# Patient Record
Sex: Female | Born: 1955 | Race: White | Hispanic: No | Smoking: Former smoker
Health system: Southern US, Community
[De-identification: ages and names within clinical notes are randomized; demographics above are authoritative.]

## PROBLEM LIST (undated history)

## (undated) DIAGNOSIS — K635 Polyp of colon: Secondary | ICD-10-CM

## (undated) DIAGNOSIS — F32A Depression, unspecified: Secondary | ICD-10-CM

## (undated) DIAGNOSIS — E039 Hypothyroidism, unspecified: Secondary | ICD-10-CM

## (undated) DIAGNOSIS — N390 Urinary tract infection, site not specified: Secondary | ICD-10-CM

## (undated) DIAGNOSIS — H269 Unspecified cataract: Secondary | ICD-10-CM

## (undated) DIAGNOSIS — E785 Hyperlipidemia, unspecified: Secondary | ICD-10-CM

## (undated) DIAGNOSIS — G4730 Sleep apnea, unspecified: Secondary | ICD-10-CM

## (undated) DIAGNOSIS — R32 Unspecified urinary incontinence: Secondary | ICD-10-CM

## (undated) DIAGNOSIS — T7840XA Allergy, unspecified, initial encounter: Secondary | ICD-10-CM

## (undated) HISTORY — DX: Polyp of colon: K63.5

## (undated) HISTORY — PX: CHOLECYSTECTOMY: SHX55

## (undated) HISTORY — PX: MOUTH SURGERY: SHX715

## (undated) HISTORY — DX: Urinary tract infection, site not specified: N39.0

## (undated) HISTORY — DX: Depression, unspecified: F32.A

## (undated) HISTORY — DX: Unspecified cataract: H26.9

## (undated) HISTORY — DX: Sleep apnea, unspecified: G47.30

## (undated) HISTORY — DX: Unspecified urinary incontinence: R32

## (undated) HISTORY — DX: Hyperlipidemia, unspecified: E78.5

## (undated) HISTORY — PX: TUBAL LIGATION: SHX77

## (undated) HISTORY — DX: Allergy, unspecified, initial encounter: T78.40XA

## (undated) HISTORY — PX: EYE SURGERY: SHX253

## (undated) HISTORY — PX: BREAST BIOPSY: SHX20

---

## 2014-09-02 ENCOUNTER — Emergency Department: Payer: Self-pay | Admitting: Emergency Medicine

## 2014-09-02 LAB — BASIC METABOLIC PANEL
Anion Gap: 14 (ref 7–16)
BUN: 21 mg/dL — ABNORMAL HIGH (ref 7–18)
CREATININE: 0.74 mg/dL (ref 0.60–1.30)
Calcium, Total: 8.9 mg/dL (ref 8.5–10.1)
Chloride: 106 mmol/L (ref 98–107)
Co2: 26 mmol/L (ref 21–32)
EGFR (African American): >60
EGFR (Non-African Amer.): >60
Glucose: 98 mg/dL (ref 65–99)
Osmolality: 294 (ref 275–301)
Potassium: 3.1 mmol/L — ABNORMAL LOW (ref 3.5–5.1)
Sodium: 146 mmol/L — ABNORMAL HIGH (ref 136–145)

## 2014-09-02 LAB — CBC WITH DIFFERENTIAL/PLATELET
BASOS ABS: 0.1 10*3/uL (ref 0.0–0.1)
Basophil %: 1 %
EOS ABS: 0.1 10*3/uL (ref 0.0–0.7)
Eosinophil %: 1.3 %
HCT: 40.4 % (ref 35.0–47.0)
HGB: 13.8 g/dL (ref 12.0–16.0)
LYMPHS ABS: 3.8 10*3/uL — AB (ref 1.0–3.6)
Lymphocyte %: 43 %
MCH: 30.1 pg (ref 26.0–34.0)
MCHC: 34.1 g/dL (ref 32.0–36.0)
MCV: 88 fL (ref 80–100)
MONO ABS: 0.8 x10 3/mm (ref 0.2–0.9)
Monocyte %: 8.5 %
NEUTROS PCT: 46.2 %
Neutrophil #: 4.1 10*3/uL (ref 1.4–6.5)
Platelet: 335 10*3/uL (ref 150–440)
RBC: 4.57 10*6/uL (ref 3.80–5.20)
RDW: 13.2 % (ref 11.5–14.5)
WBC: 8.9 10*3/uL (ref 3.6–11.0)

## 2014-09-02 LAB — TROPONIN I

## 2014-09-02 LAB — HEPATIC FUNCTION PANEL A (ARMC)
ALK PHOS: 96 U/L
AST: 25 U/L (ref 15–37)
Albumin: 4.1 g/dL (ref 3.4–5.0)
BILIRUBIN TOTAL: 0.3 mg/dL (ref 0.2–1.0)
SGPT (ALT): 29 U/L
TOTAL PROTEIN: 7.7 g/dL (ref 6.4–8.2)

## 2014-09-02 LAB — LIPASE, BLOOD: Lipase: 300 U/L (ref 73–393)

## 2014-09-24 ENCOUNTER — Ambulatory Visit: Payer: Self-pay | Admitting: Surgery

## 2015-02-23 NOTE — Op Note (Signed)
PATIENT NAME:  Carolyn Shea, Carolyn Shea MR#:  [id number] DATE OF BIRTH:  1956/09/26  DATE OF PROCEDURE:  09/24/2014   PREOPERATIVE DIAGNOSIS: Symptomatic cholelithiasis.   POSTOPERATIVE DIAGNOSIS: Symptomatic cholelithiasis.  PROCEDURE PERFORMED:  Laparoscopic cholecystectomy.   ANESTHESIA: General.   ESTIMATED BLOOD LOSS: 25 mL.   COMPLICATIONS: None.   SPECIMENS: Gallbladder.   INDICATION FOR SURGERY:  Ms. [name] is a pleasant 59 year old female who presented with recurrent right upper quadrant pain with fatty meals. She had a large gallstone on ultrasound. Her pain appeared biliary, and thus, I thought she would benefit from cholecystectomy.   DETAILS OF PROCEDURE: Informed was obtained. Ms. [name] was brought to the Operating Room suite. She was induced. Endotracheal tube was placed. General anesthesia was administered.  Her abdomen was prepped and draped in standard surgical fashion. A timeout was then performed correctly identifying the patient name, operative site and procedure to be performed. A supraumbilical incision was made and was deepened down to the fascia. The fascia was incised. The peritoneum was entered. Two stay sutures were placed through the fasciotomy. A Hassan trocar was placed in the abdomen. The abdomen was insufflated.  A 1 mm epigastric and 2 right subcostal 5 mm trocars are placed. The gallbladder was then lifted over the dome of the liver, cystic duct and cystic artery were dissected out. Critical view was obtained. Both structures were clipped and ligated. The gallbladder was then taken off the gallbladder fossa with cautery. It was then brought out through the umbilical port site. Hemostasis was obtained in the gallbladder fossa, which was mildly oozing.  When hemostasis was obtained, the abdomen had been thoroughly irrigated, abdomen was desufflated, all trocars were removed under direct visualization.  The previously placed stay sutures were tied to close the  fasciotomy in a figure of eight..  The skin was then closed with interrupted 4-0 Monocryl deep dermal sutures.  Steri-Strips, Telfa gauze and Tegaderm were used to complete the dressing. The patient was then awoken, extubated and brought to the post anesthesia care unit. There were no immediate complications. Needle, sponge, and instrument count was correct at the end of the procedure.      ____________________________ Si Raiderhristopher A. Ane Conerly, MD cal:DT D: 09/24/2014 13:19:56 ET T: 09/24/2014 14:45:29 ET JOB#: [id number]  cc: Cristal Deerhristopher A. Essance Gatti, MD, <Dictator> Jarvis NewcomerHRISTOPHER A Jandy Brackens MD ELECTRONICALLY SIGNED 10/08/2014 20:07

## 2015-02-25 LAB — SURGICAL PATHOLOGY

## 2015-12-16 DIAGNOSIS — G4700 Insomnia, unspecified: Secondary | ICD-10-CM | POA: Insufficient documentation

## 2015-12-16 DIAGNOSIS — E782 Mixed hyperlipidemia: Secondary | ICD-10-CM | POA: Insufficient documentation

## 2015-12-16 DIAGNOSIS — E039 Hypothyroidism, unspecified: Secondary | ICD-10-CM | POA: Insufficient documentation

## 2016-01-14 DIAGNOSIS — Z961 Presence of intraocular lens: Secondary | ICD-10-CM | POA: Insufficient documentation

## 2016-02-17 DIAGNOSIS — H26492 Other secondary cataract, left eye: Secondary | ICD-10-CM | POA: Insufficient documentation

## 2016-02-20 DIAGNOSIS — M792 Neuralgia and neuritis, unspecified: Secondary | ICD-10-CM | POA: Insufficient documentation

## 2016-08-08 IMAGING — US ABDOMEN ULTRASOUND LIMITED
1 series · 14 of 25 positions shown · non-contrast
Comparison: None.

CLINICAL DATA: Severe right upper quadrant pain. Nausea and
vomiting for 2 days.

EXAM:
US ABDOMEN LIMITED - RIGHT UPPER QUADRANT

[Series 1: abdomen ultrasound limited · 0.25mm/px · 14 of 41 slices shown]
[im 1/41]
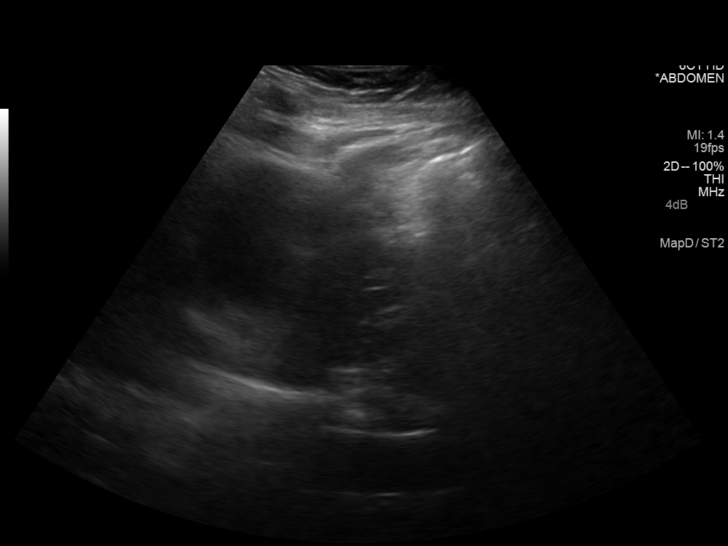
[im 4/41]
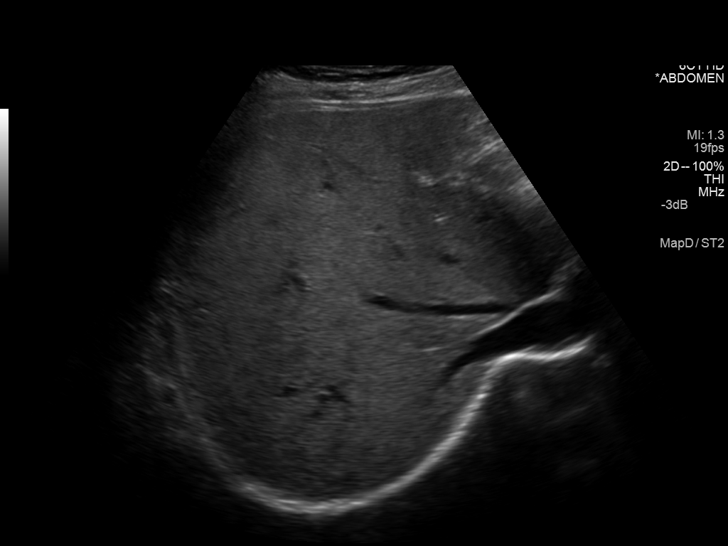
[im 7/41]
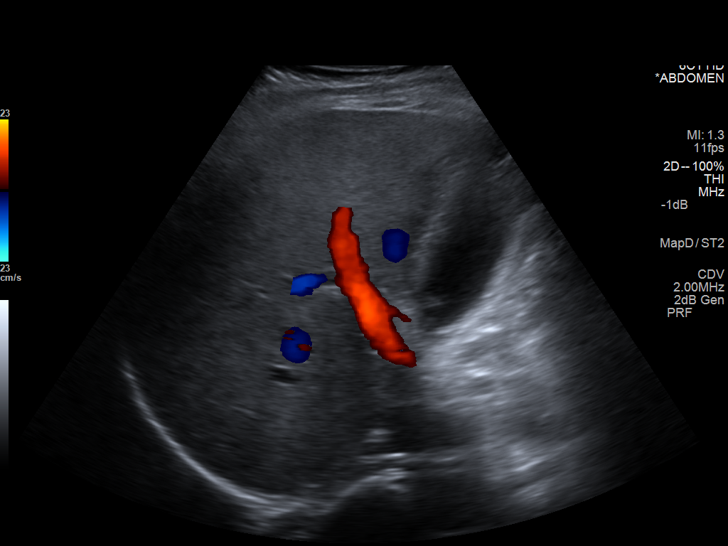
[im 11/41]
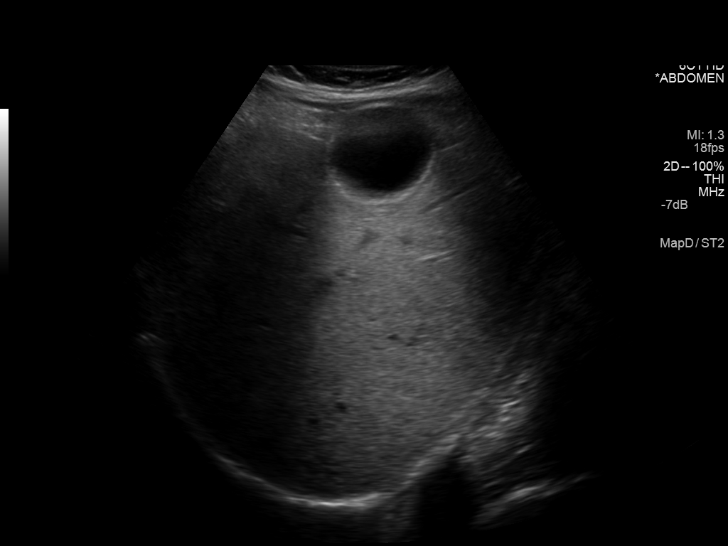
[im 14/41]
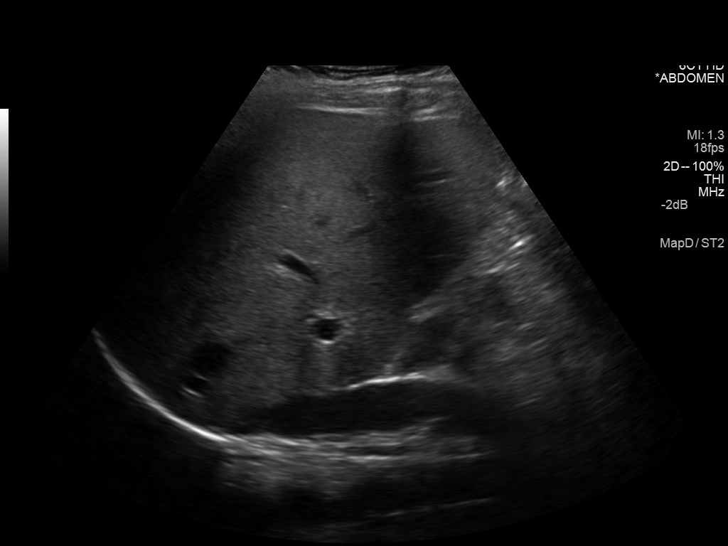
[im 16/41]
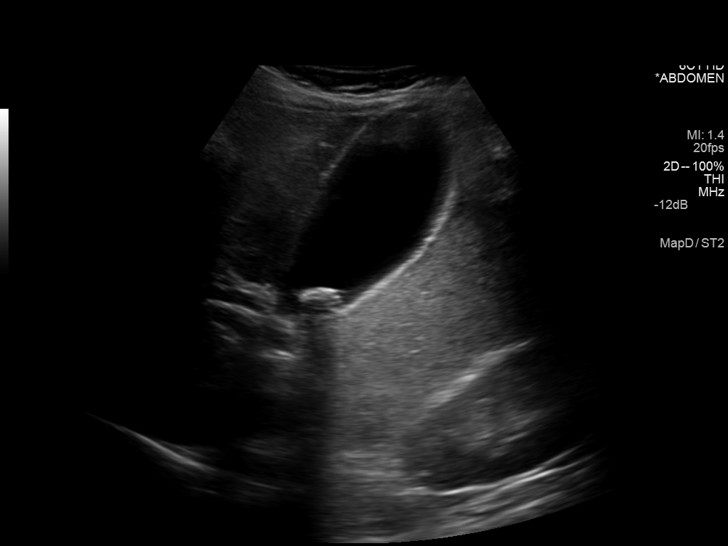
[im 19/41]
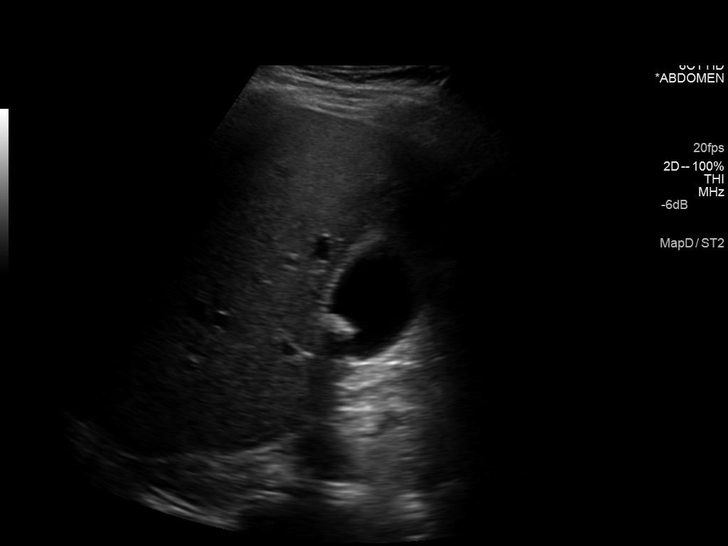
[im 22/41]
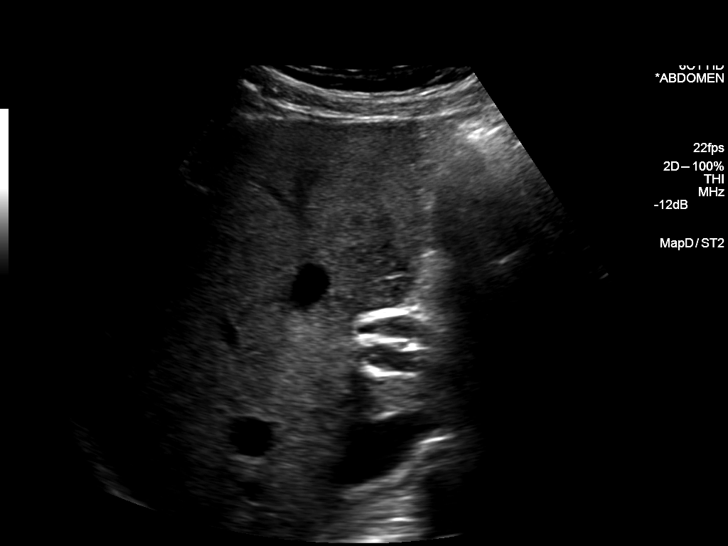
[im 26/41]
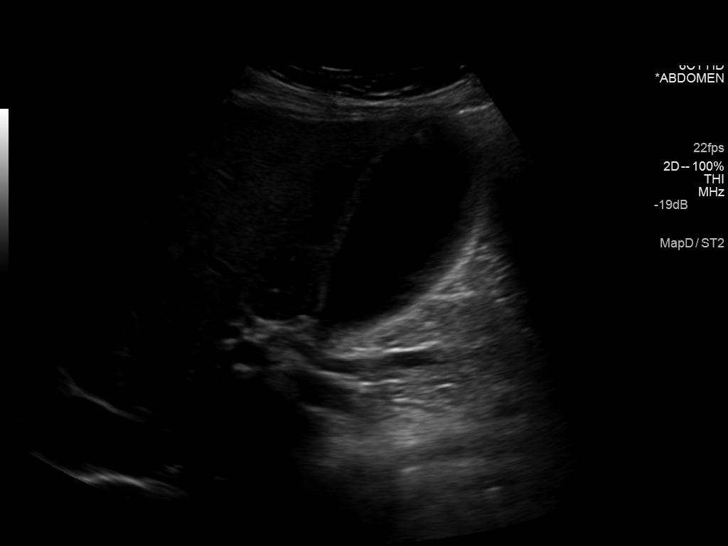
[im 27/41]
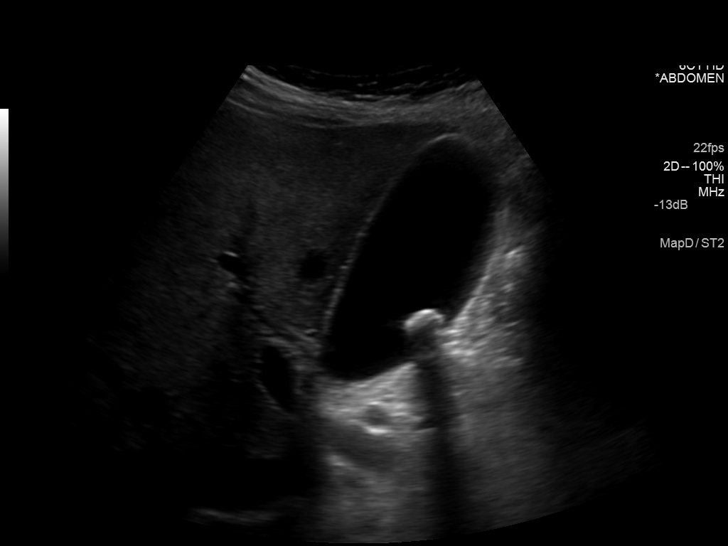
[im 31/41]
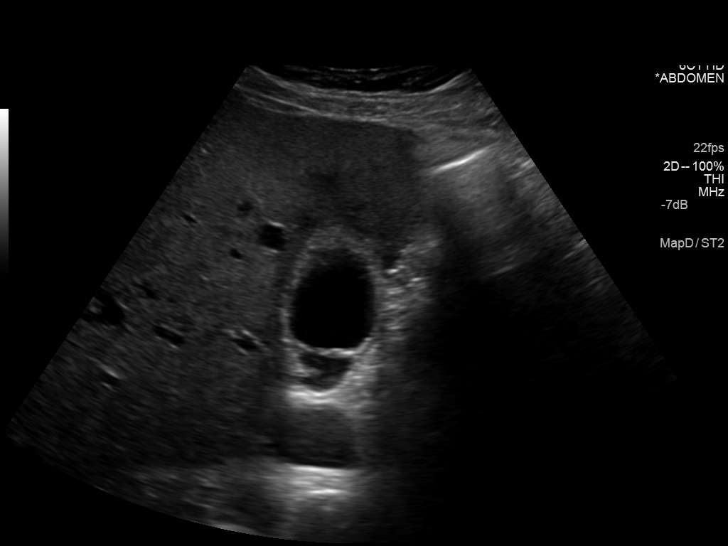
[im 34/41]
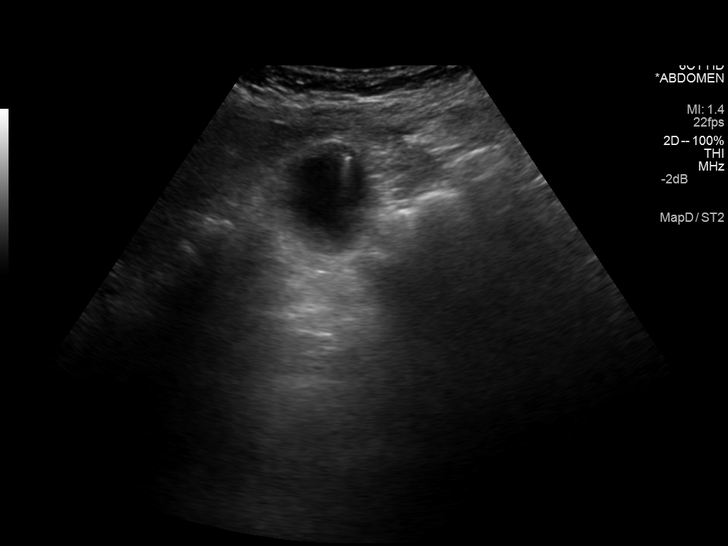
[im 37/41]
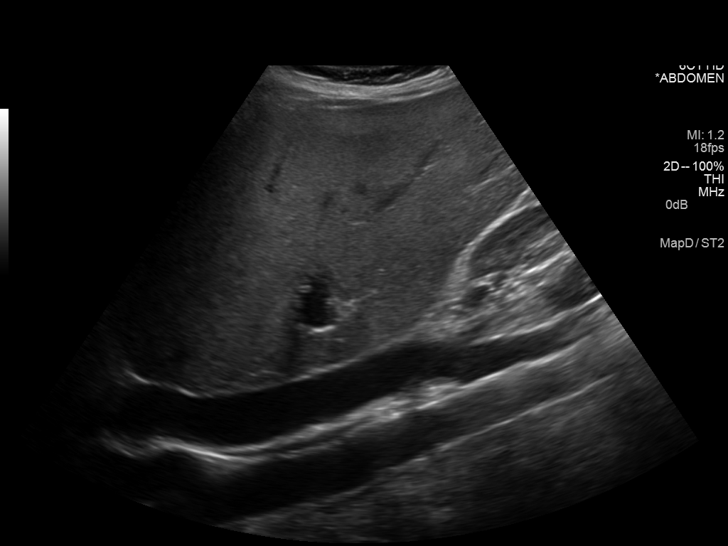
[im 41/41]
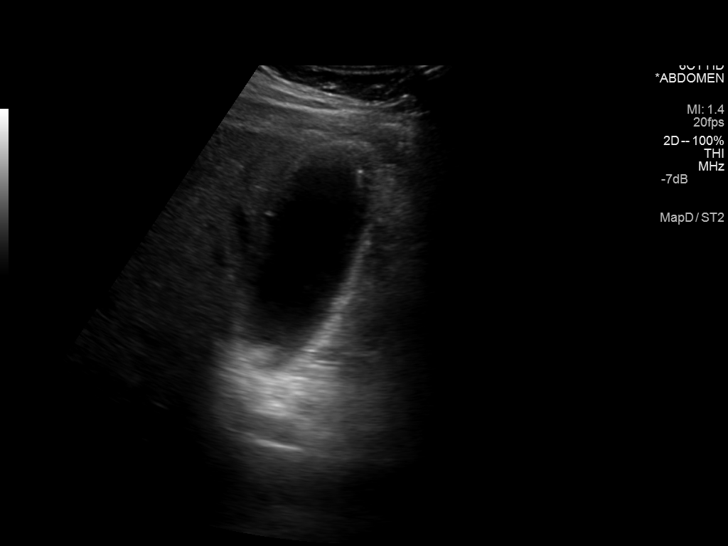

[14 of 25 positions shown; findings below may reference images not displayed]

FINDINGS: Gallbladder:

Echogenic gallstone measures up to 1.3 cm. No significant
gallbladder wall thickening. The patient does not have a sonographic
Murphy's sign. Small echogenic area with a comet tail sign at the
gallbladder fundus. This could represent a focus of adenomyomatosis.

Common bile duct:

Diameter: 0.7 cm

Liver:

No focal lesion identified. Within normal limits in parenchymal
echogenicity. Limited evaluation of the left hepatic lobe.
IMPRESSION: Cholelithiasis.  No evidence for acute cholecystitis.

Question a small focus of adenomyomatosis at the gallbladder fundus.

Common bile duct is upper limits of normal.

## 2019-02-23 DIAGNOSIS — F330 Major depressive disorder, recurrent, mild: Secondary | ICD-10-CM | POA: Insufficient documentation

## 2020-07-05 DIAGNOSIS — M8580 Other specified disorders of bone density and structure, unspecified site: Secondary | ICD-10-CM | POA: Insufficient documentation

## 2023-01-19 NOTE — Progress Notes (Unsigned)
Tomasita Morrow, NP-C Phone: [phone]  Carolyn Shea is a 67 y.o. female who presents today to establish care. She has no complaints or new concerns. She is requesting refills on her medications. She has an annual exam and labs with her last PCP in October. Note and results reviewed. Patient declines lab work today as she is not fasting and had labs in the last 6 months. She would like to return for labs prior to her annual exam in October. She is currently participating in a clinical trial with [facility] for Phentermine, she is seen by them monthly regarding this.   HYPOTHYROIDISM Disease Monitoring Weight changes: No  Skin Changes: No Palpitations: No Heat/Cold intolerance: No Medication Monitoring Compliance:  Levothyroxine 50 mcg   Last TSH 07/25/2022- 2.06  HYPERLIPIDEMIA Symptoms Chest pain on exertion:  No   Leg claudication:   No Medications: Compliance- Diet controlled Right upper quadrant pain- No  Muscle aches- No Lipid Panel 07/25/2022- LDL- 139, Total Cholesterol- 225, HDL- 71  Depression/Insomnia- Currently on Cymbalta 20 mg daily and Trazodone 100 mg at bedtime. Reports doing well on both medications. Symptoms are well managed. Denies SI/HI.   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   Resolved Ambulatory Problems    Diagnosis Date Noted   No Resolved Ambulatory Problems   Past Medical History:  Diagnosis Date   Colon polyps    Depression    Urine incontinence    UTI (urinary tract infection)     Family History  Problem Relation Age of Onset   COPD Mother    Cancer Mother    Diabetes Sister    Depression Sister    COPD Sister     Cancer Sister    Mental illness Sister    Cancer Sister    COPD Sister    Depression Sister    Diabetes Sister    Asthma Son    Heart disease Maternal Grandmother    Diabetes Maternal Grandmother    Heart disease Maternal Grandfather    COPD Maternal Grandfather    Cancer Maternal Grandfather     Social History   Socioeconomic History   Marital status: Unknown    Spouse name: Not on file   Number of children: Not on file   Years of education: Not on file   Highest education level: Not on file  Occupational History   Not on file  Tobacco Use   Smoking status: Former    Types: Cigarettes   Smokeless tobacco: Never  Substance and Sexual Activity   Alcohol use: Not on file   Drug use: Not on file   Sexual activity: Not Currently  Other Topics Concern   Not on file  Social History Narrative   Not on file   Social Determinants of Health   Financial Resource Strain: Not on file  Food Insecurity: Not on file  Transportation Needs: Not on file  Physical Activity: Not on file  Stress: Not on file  Social Connections: Not on file  Intimate Partner Violence: Not on file    ROS  General:  Negative for unexplained weight loss, fever Skin: Negative for new or changing mole, sore that won't heal HEENT:  Negative for trouble hearing, trouble seeing, ringing in ears, mouth sores, hoarseness, change in voice, dysphagia. CV:  Negative for chest pain, dyspnea, edema, palpitations Resp: Negative for cough, dyspnea, hemoptysis GI: Negative for nausea, vomiting, diarrhea, constipation, abdominal pain, melena, hematochezia. GU: Negative for dysuria, incontinence, urinary hesitance, hematuria, vaginal or penile discharge, polyuria, sexual difficulty, lumps in testicle or breasts MSK: Negative for muscle cramps or aches, joint pain or swelling Neuro: Negative for headaches, weakness, numbness, dizziness, passing out/fainting Psych: Negative for depression, anxiety, memory  problems  Objective  Physical Exam Vitals:   01/20/23 1308  BP: 122/78  Pulse: 69  Temp: 97.6 F (36.4 C)  SpO2: 99%    BP Readings from Last 3 Encounters:  01/20/23 122/78   Wt Readings from Last 3 Encounters:  01/20/23 168 lb 12.8 oz (76.6 kg)    Physical Exam Constitutional:      General: She is not in acute distress.    Appearance: Normal appearance.  HENT:     Head: Normocephalic.  Cardiovascular:     Rate and Rhythm: Normal rate and regular rhythm.     Heart sounds: Normal heart sounds.  Pulmonary:     Effort: Pulmonary effort is normal.     Breath sounds: Normal breath sounds.  Skin:    General: Skin is warm and dry.  Neurological:     General: No focal deficit present.     Mental Status: She is alert.  Psychiatric:        Mood and Affect: Mood normal.        Behavior: Behavior normal.    Assessment/Plan:   Hypothyroidism, unspecified type Assessment & Plan: Chronic. Stable on Levothyroxine 50 mcg daily. Continue. Refills sent.   Orders: -     Levothyroxine Sodium; Take 1 tablet (50 mcg total) by mouth daily before breakfast.  Dispense: 90 tablet; Refill: 3  Mixed hyperlipidemia Assessment & Plan: Chronic. Stable with diet control. Continue. Encouraged healthy diet and exercise. Will check fast lipids prior to annual exam.   Mild episode of recurrent major depressive disorder [facility]) Assessment & Plan: Chronic. Stable on Cymbalta 20 mg daily. Continue. Refills sent. Symptoms well managed at this time. Encouraged patient to contact if symptoms are changing or worsening.  Orders: -     DULoxetine HCl; Take 1 capsule (20 mg total) by mouth daily.  Dispense: 90 capsule; Refill: 3  Insomnia, unspecified type Assessment & Plan: Chronic. Stable on Trazodone 100 mg daily at bedtime. Continue. Refills sent.   Orders: -     traZODone HCl; Take 1 tablet (100 mg total) by mouth at bedtime.  Dispense: 90 tablet; Refill: 3  Screening mammogram for breast  cancer -     3D Screening Mammogram, Left and Right; Future  Screen for colon cancer -     Ambulatory referral to Gastroenterology    Return in about 7 months (around 08/09/2023) for Annual Exam. Please complete lab work 2-3 days prior.   Tomasita Morrow, NP-C [place]

## 2023-01-20 ENCOUNTER — Encounter: Payer: Self-pay | Admitting: Nurse Practitioner

## 2023-01-20 ENCOUNTER — Ambulatory Visit (INDEPENDENT_AMBULATORY_CARE_PROVIDER_SITE_OTHER): Payer: Medicare Other | Admitting: Nurse Practitioner

## 2023-01-20 VITALS — BP 122/78 | HR 69 | Temp 97.6°F | Ht 62.0 in | Wt 168.8 lb

## 2023-01-20 DIAGNOSIS — E782 Mixed hyperlipidemia: Secondary | ICD-10-CM

## 2023-01-20 DIAGNOSIS — E039 Hypothyroidism, unspecified: Secondary | ICD-10-CM

## 2023-01-20 DIAGNOSIS — F330 Major depressive disorder, recurrent, mild: Secondary | ICD-10-CM | POA: Diagnosis not present

## 2023-01-20 DIAGNOSIS — Z1211 Encounter for screening for malignant neoplasm of colon: Secondary | ICD-10-CM | POA: Diagnosis not present

## 2023-01-20 DIAGNOSIS — Z1231 Encounter for screening mammogram for malignant neoplasm of breast: Secondary | ICD-10-CM

## 2023-01-20 DIAGNOSIS — G4700 Insomnia, unspecified: Secondary | ICD-10-CM

## 2023-01-20 MED ORDER — LEVOTHYROXINE SODIUM 50 MCG PO TABS: 50.0000 ug | ORAL_TABLET | Freq: Every day | ORAL | 3 refills | Status: DC

## 2023-01-20 MED ORDER — TRAZODONE HCL 100 MG PO TABS: 100.0000 mg | ORAL_TABLET | Freq: Every day | ORAL | 3 refills | Status: DC

## 2023-01-20 MED ORDER — DULOXETINE HCL 20 MG PO CPEP: 20.0000 mg | ORAL_CAPSULE | Freq: Every day | ORAL | 3 refills | Status: DC

## 2023-01-20 NOTE — Assessment & Plan Note (Addendum)
Chronic. Stable with diet control. Continue. Encouraged healthy diet and exercise. Will check fast lipids prior to annual exam.

## 2023-01-20 NOTE — Assessment & Plan Note (Addendum)
Chronic. Stable on Cymbalta 20 mg daily. Continue. Refills sent. Symptoms well managed at this time. Encouraged patient to contact if symptoms are changing or worsening.

## 2023-01-20 NOTE — Assessment & Plan Note (Signed)
Chronic. Stable on Levothyroxine 50 mcg daily. Continue. Refills sent.

## 2023-01-20 NOTE — Patient Instructions (Signed)
YOUR MAMMOGRAM IS DUE, PLEASE CALL AND GET THIS SCHEDULED! [facility] - call [phone]    

## 2023-01-20 NOTE — Assessment & Plan Note (Addendum)
Chronic. Stable on Trazodone 100 mg daily at bedtime. Continue. Refills sent.

## 2023-01-25 ENCOUNTER — Telehealth: Payer: Self-pay

## 2023-01-25 DIAGNOSIS — Z1211 Encounter for screening for malignant neoplasm of colon: Secondary | ICD-10-CM

## 2023-01-25 NOTE — Telephone Encounter (Signed)
Gastroenterology Pre-Procedure Review  Patient has requested to call back to schedule.  She has to arrange for someone to accompany her.  Request Date: TBD Requesting Physician: Dr. Dellie Catholic  PATIENT REVIEW QUESTIONS: The patient responded to the following health history questions as indicated:    1. Are you having any GI issues? no 2. Do you have a personal history of Polyps? no 3. Do you have a family history of Colon Cancer or Polyps? no 4. Diabetes Mellitus? no 5. Joint replacements in the past 12 months?no 6. Major health problems in the past 3 months?no 7. Any artificial heart valves, MVP, or defibrillator?no    MEDICATIONS & ALLERGIES:    Patient reports the following regarding taking any anticoagulation/antiplatelet therapy:   Plavix, Coumadin, Eliquis, Xarelto, Lovenox, Pradaxa, Brilinta, or Effient? no Aspirin? no  Patient confirms/reports the following medications:  Current Outpatient Medications  Medication Sig Dispense Refill   Cholecalciferol (VITAMIN D) 50 MCG (2000 UT) CAPS Take by mouth.     cyanocobalamin (VITAMIN B12) 500 MCG tablet Take 1,000 mcg by mouth.     DULoxetine (CYMBALTA) 20 MG capsule Take 1 capsule (20 mg total) by mouth daily. 90 capsule 3   Flaxseed, Linseed, (FLAX SEED OIL) 1000 MG CAPS Take by mouth.     glucosamine-chondroitin 500-400 MG tablet Take by mouth.     levothyroxine (SYNTHROID) 50 MCG tablet Take 1 tablet (50 mcg total) by mouth daily before breakfast. 90 tablet 3   Multiple Vitamin (MULTIVITAMIN) capsule Take 1 capsule by mouth daily.     Phentermine HCl 8 MG TABS Take 1 tablet by mouth in the morning, at noon, and at bedtime.     traZODone (DESYREL) 100 MG tablet Take 1 tablet (100 mg total) by mouth at bedtime. 90 tablet 3   No current facility-administered medications for this visit.    Patient confirms/reports the following allergies:  Allergies  Allergen Reactions   Ciprofloxacin Palpitations    Patient had taken this with  Cymbalta at the time.    No orders of the defined types were placed in this encounter.   AUTHORIZATION INFORMATION Primary Insurance: 1D#: Group #:  Secondary Insurance: 1D#: Group #:  SCHEDULE INFORMATION: Date: TBD Time: Location: [place]

## 2023-02-03 ENCOUNTER — Telehealth: Payer: Self-pay

## 2023-02-03 ENCOUNTER — Other Ambulatory Visit: Payer: Self-pay

## 2023-02-03 DIAGNOSIS — Z1211 Encounter for screening for malignant neoplasm of colon: Secondary | ICD-10-CM

## 2023-02-03 MED ORDER — NA SULFATE-K SULFATE-MG SULF 17.5-3.13-1.6 GM/177ML PO SOLN: 1.0000 | Freq: Once | ORAL | 0 refills | Status: AC

## 2023-02-03 NOTE — Telephone Encounter (Signed)
Patient requesting call back to schedule colonoscopy.  ?

## 2023-02-03 NOTE — Telephone Encounter (Signed)
Gastroenterology Pre-Procedure Review  Request Date: 02/22/23 Requesting Physician: Dr. Vicente Males  PATIENT REVIEW QUESTIONS: The patient responded to the following health history questions as indicated:    Patient advised to stop Phentermine 1 day prior to colonoscopy.  1. Are you having any GI issues? no 2. Do you have a personal history of Polyps? no 3. Do you have a family history of Colon Cancer or Polyps? no 4. Diabetes Mellitus? no 5. Joint replacements in the past 12 months?no 6. Major health problems in the past 3 months?no 7. Any artificial heart valves, MVP, or defibrillator?no    MEDICATIONS & ALLERGIES:    Patient reports the following regarding taking any anticoagulation/antiplatelet therapy:   Plavix, Coumadin, Eliquis, Xarelto, Lovenox, Pradaxa, Brilinta, or Effient? no Aspirin? no  Patient confirms/reports the following medications:  Current Outpatient Medications  Medication Sig Dispense Refill   Cholecalciferol (VITAMIN D) 50 MCG (2000 UT) CAPS Take by mouth.     cyanocobalamin (VITAMIN B12) 500 MCG tablet Take 1,000 mcg by mouth.     DULoxetine (CYMBALTA) 20 MG capsule Take 1 capsule (20 mg total) by mouth daily. 90 capsule 3   Flaxseed, Linseed, (FLAX SEED OIL) 1000 MG CAPS Take by mouth.     glucosamine-chondroitin 500-400 MG tablet Take by mouth.     levothyroxine (SYNTHROID) 50 MCG tablet Take 1 tablet (50 mcg total) by mouth daily before breakfast. 90 tablet 3   Multiple Vitamin (MULTIVITAMIN) capsule Take 1 capsule by mouth daily.     Phentermine HCl 8 MG TABS Take 1 tablet by mouth in the morning, at noon, and at bedtime.     traZODone (DESYREL) 100 MG tablet Take 1 tablet (100 mg total) by mouth at bedtime. 90 tablet 3   No current facility-administered medications for this visit.    Patient confirms/reports the following allergies:  Allergies  Allergen Reactions   Ciprofloxacin Palpitations    Patient had taken this with Cymbalta at the time.    No  orders of the defined types were placed in this encounter.   AUTHORIZATION INFORMATION Primary Insurance: 1D#: Group #:  Secondary Insurance: 1D#: Group #:  SCHEDULE INFORMATION: Date:  Time: Location:

## 2023-02-03 NOTE — Addendum Note (Signed)
Addended by: Vanetta Mulders on: 02/03/2023 03:01 PM   Modules accepted: Orders

## 2023-02-04 ENCOUNTER — Ambulatory Visit
Admission: RE | Admit: 2023-02-04 | Discharge: 2023-02-04 | Disposition: A | Discharge status: Home / Self Care | Payer: Medicare Other | Source: Ambulatory Visit | Attending: Nurse Practitioner | Admitting: Nurse Practitioner

## 2023-02-04 DIAGNOSIS — Z1231 Encounter for screening mammogram for malignant neoplasm of breast: Secondary | ICD-10-CM | POA: Insufficient documentation

## 2023-02-09 ENCOUNTER — Inpatient Hospital Stay
Admission: RE | Admit: 2023-02-09 | Discharge: 2023-02-09 | Disposition: A | Discharge status: Home / Self Care | Payer: Self-pay | Source: Ambulatory Visit | Attending: Nurse Practitioner | Admitting: Nurse Practitioner

## 2023-02-09 ENCOUNTER — Other Ambulatory Visit: Payer: Self-pay | Admitting: *Deleted

## 2023-02-09 DIAGNOSIS — Z1231 Encounter for screening mammogram for malignant neoplasm of breast: Secondary | ICD-10-CM

## 2023-02-15 ENCOUNTER — Encounter: Payer: Self-pay | Admitting: Gastroenterology

## 2023-02-19 ENCOUNTER — Encounter: Payer: Self-pay | Admitting: Gastroenterology

## 2023-02-22 ENCOUNTER — Encounter
Admission: RE | Disposition: A | Discharge status: Home / Self Care | Payer: Self-pay | Source: Home / Self Care | Attending: Gastroenterology

## 2023-02-22 ENCOUNTER — Ambulatory Visit: Payer: Medicare Other | Admitting: General Practice

## 2023-02-22 ENCOUNTER — Ambulatory Visit
Admission: RE | Admit: 2023-02-22 | Discharge: 2023-02-22 | Disposition: A | Discharge status: Home / Self Care | Payer: Medicare Other | Attending: Gastroenterology | Admitting: Gastroenterology

## 2023-02-22 ENCOUNTER — Encounter: Payer: Self-pay | Admitting: Gastroenterology

## 2023-02-22 DIAGNOSIS — Z1211 Encounter for screening for malignant neoplasm of colon: Secondary | ICD-10-CM | POA: Insufficient documentation

## 2023-02-22 DIAGNOSIS — Z538 Procedure and treatment not carried out for other reasons: Secondary | ICD-10-CM | POA: Diagnosis not present

## 2023-02-22 DIAGNOSIS — Z87891 Personal history of nicotine dependence: Secondary | ICD-10-CM | POA: Diagnosis not present

## 2023-02-22 DIAGNOSIS — E039 Hypothyroidism, unspecified: Secondary | ICD-10-CM | POA: Diagnosis not present

## 2023-02-22 DIAGNOSIS — Z800 Family history of malignant neoplasm of digestive organs: Secondary | ICD-10-CM | POA: Insufficient documentation

## 2023-02-22 HISTORY — DX: Hypothyroidism, unspecified: E03.9

## 2023-02-22 HISTORY — PX: COLONOSCOPY WITH PROPOFOL: SHX5780

## 2023-02-22 SURGERY — COLONOSCOPY WITH PROPOFOL
Anesthesia: General

## 2023-02-22 MED ORDER — LIDOCAINE HCL (PF) 2 % IJ SOLN
INTRAMUSCULAR | Status: AC
  Filled 2023-02-22: qty 5

## 2023-02-22 MED ORDER — PROPOFOL 10 MG/ML IV BOLUS
INTRAVENOUS | Status: DC | PRN
  Administered 2023-02-22: 80 mg via INTRAVENOUS

## 2023-02-22 MED ORDER — SODIUM CHLORIDE 0.9 % IV SOLN: INTRAVENOUS | Status: DC

## 2023-02-22 MED ORDER — PROPOFOL 1000 MG/100ML IV EMUL
INTRAVENOUS | Status: AC
  Filled 2023-02-22: qty 100

## 2023-02-22 MED ORDER — LIDOCAINE HCL (CARDIAC) PF 100 MG/5ML IV SOSY
PREFILLED_SYRINGE | INTRAVENOUS | Status: DC | PRN
  Administered 2023-02-22: 40 mg via INTRAVENOUS

## 2023-02-22 MED ORDER — PROPOFOL 500 MG/50ML IV EMUL
INTRAVENOUS | Status: DC | PRN
  Administered 2023-02-22: 150 ug/kg/min via INTRAVENOUS

## 2023-02-22 NOTE — Transfer of Care (Signed)
Immediate Anesthesia Transfer of Care Note  Patient: Carolyn Shea  Procedure(s) Performed: Procedure(s): COLONOSCOPY WITH PROPOFOL (N/A)  Patient Location: PACU and Endoscopy Unit  Anesthesia Type:General  Level of Consciousness: sedated  Airway & Oxygen Therapy: Patient Spontanous Breathing and Patient connected to nasal cannula oxygen  Post-op Assessment: Report given to RN and Post -op Vital signs reviewed and stable  Post vital signs: Reviewed and stable  Last Vitals:  Vitals:   02/22/23 0834 02/22/23 0915  BP: 130/71 (!) 100/52  Pulse: 60 (!) 59  Resp: 17 14  Temp: (!) 35.7 C (!) 35.6 C  SpO2: 99% 100%    Complications: No apparent anesthesia complications

## 2023-02-22 NOTE — Op Note (Signed)
[facility] Patient Name: Carolyn Shea Procedure Date: 02/22/2023 8:50 AM MRN: [id number] Account #: [id number] Date of Birth: [birth year] Admit Type: Outpatient Age: 67 Room: [facility] ENDO ROOM 1 Gender: Female Note Status: Finalized Instrument Name: Prentice Docker [id number] Procedure:             Colonoscopy Indications:           Screening in patient at increased risk: Family history                         of 1st-degree relative with colorectal cancer Providers:             Wyline Mood MD, MD Referring MD:          No Local Md, MD (Referring MD) Medicines:             Monitored Anesthesia Care Complications:         No immediate complications. Procedure:             Pre-Anesthesia Assessment:                        - Prior to the procedure, a History and Physical was                         performed, and patient medications, allergies and                         sensitivities were reviewed. The patient's tolerance                         of previous anesthesia was reviewed.                        - The risks and benefits of the procedure and the                         sedation options and risks were discussed with the                         patient. All questions were answered and informed                         consent was obtained.                        - ASA Grade Assessment: II - A patient with mild                         systemic disease.                        After obtaining informed consent, the colonoscope was                         passed under direct vision. Throughout the procedure,                         the patient's blood pressure, pulse, and oxygen  saturations were monitored continuously. The                         Colonoscope was introduced through the anus with the                         intention of advancing to the cecum. The scope was                         advanced to the transverse colon  before the procedure                         was aborted. Medications were given. The colonoscopy                         was performed with ease. The patient tolerated the                         procedure well. The quality of the bowel preparation                         was inadequate. Findings:      The perianal and digital rectal examinations were normal.      Copious quantities of semi-liquid stool was found in the sigmoid colon,       in the descending colon and in the transverse colon, interfering with       visualization. Impression:            - Preparation of the colon was inadequate.                        - Stool in the sigmoid colon, in the descending colon                         and in the transverse colon.                        - No specimens collected. Recommendation:        - Discharge patient to home (with escort).                        - Resume previous diet.                        - Continue present medications.                        - Repeat colonoscopy in 2 weeks because the bowel                         preparation was suboptimal. Procedure Code(s):     --- Professional ---                        [phone], 53, Colonoscopy, flexible; diagnostic,                         including collection of specimen(s) by brushing or  washing, when performed (separate procedure) Diagnosis Code(s):     --- Professional ---                        Z80.0, Family history of malignant neoplasm of                         digestive organs CPT copyright 2022 American Medical Association. All rights reserved. The codes documented in this report are preliminary and upon coder review may  be revised to meet current compliance requirements. Wyline Mood, MD Wyline Mood MD, MD 02/22/2023 9:11:45 AM This report has been signed electronically. Number of Addenda: 0 Note Initiated On: 02/22/2023 8:50 AM Total Procedure Duration: 0 hours 6 minutes 26 seconds  Estimated  Blood Loss:  Estimated blood loss: none.      [facility]

## 2023-02-22 NOTE — H&P (Signed)
Wyline Mood, MD [address], [address], [place], [place], [address] [address], [address], [place], [place], [address] Phone: [phone]  Fax: [phone]  Primary Care Physician:  Bethanie Dicker, NP   Pre-Procedure History & Physical: HPI:  Carolyn Shea is a 67 y.o. female is here for an colonoscopy.   Past Medical History:  Diagnosis Date   Colon polyps    Depression    Hypothyroidism    Urine incontinence    UTI (urinary tract infection)     Past Surgical History:  Procedure Laterality Date   BREAST BIOPSY Left    CHOLECYSTECTOMY     MOUTH SURGERY      Prior to Admission medications   Medication Sig Start Date End Date Taking? Authorizing Provider  levothyroxine (SYNTHROID) 50 MCG tablet Take 1 tablet (50 mcg total) by mouth daily before breakfast. 01/20/23  Yes Bethanie Dicker, NP  Phentermine HCl 8 MG TABS Take 1 tablet by mouth in the morning, at noon, and at bedtime.   Yes [provider]  traZODone (DESYREL) 100 MG tablet Take 1 tablet (100 mg total) by mouth at bedtime. 01/20/23  Yes Bethanie Dicker, NP  Cholecalciferol (VITAMIN D) 50 MCG (2000 UT) CAPS Take by mouth.    [provider]  cyanocobalamin (VITAMIN B12) 500 MCG tablet Take 1,000 mcg by mouth. 11/29/15   [provider]  DULoxetine (CYMBALTA) 20 MG capsule Take 1 capsule (20 mg total) by mouth daily. 01/20/23   Bethanie Dicker, NP  Flaxseed, Linseed, (FLAX SEED OIL) 1000 MG CAPS Take by mouth. 02/20/16   [provider]  glucosamine-chondroitin 500-400 MG tablet Take by mouth. 11/29/15   [provider]  Multiple Vitamin (MULTIVITAMIN) capsule Take 1 capsule by mouth daily.    [provider]    Allergies as of 02/03/2023 - Review Complete 02/03/2023  Allergen Reaction Noted   Ciprofloxacin Palpitations 02/02/2015    Family History  Problem Relation Age of Onset   COPD Mother    Cancer Mother    Diabetes Sister    Depression Sister    COPD  Sister    Cancer Sister    Mental illness Sister    Cancer Sister    COPD Sister    Depression Sister    Diabetes Sister    Asthma Son    Heart disease Maternal Grandmother    Diabetes Maternal Grandmother    Heart disease Maternal Grandfather    COPD Maternal Grandfather    Cancer Maternal Grandfather     Social History   Socioeconomic History   Marital status: Unknown    Spouse name: Not on file   Number of children: Not on file   Years of education: Not on file   Highest education level: Not on file  Occupational History   Not on file  Tobacco Use   Smoking status: Former    Types: Cigarettes   Smokeless tobacco: Never  Vaping Use   Vaping Use: Never used  Substance and Sexual Activity   Alcohol use: Not Currently   Drug use: Never   Sexual activity: Not Currently  Other Topics Concern   Not on file  Social History Narrative   Not on file   Social Determinants of Health   Financial Resource Strain: Not on file  Food Insecurity: Not on file  Transportation Needs: Not on file  Physical Activity: Not on file  Stress: Not on file  Social Connections: Not on file  Intimate Partner Violence: Not on file    Review of Systems: See HPI, otherwise negative ROS  Physical Exam: BP 130/71   Pulse 60   Temp (!) 96.2 F (35.7 C) (Temporal)   Resp 17   Wt 72.5 kg   SpO2 99%   BMI 29.23 kg/m  General:   Alert,  pleasant and cooperative in NAD Head:  Normocephalic and atraumatic. Neck:  Supple; no masses or thyromegaly. Lungs:  Clear throughout to auscultation, normal respiratory effort.    Heart:  +S1, +S2, Regular rate and rhythm, No edema. Abdomen:  Soft, nontender and nondistended. Normal bowel sounds, without guarding, and without rebound.   Neurologic:  Alert and  oriented x4;  grossly normal neurologically.  Impression/Plan: Carolyn Shea is here for an colonoscopy to be performed for family history of colon cancer.  Risks, benefits,  limitations, and alternatives regarding  colonoscopy have been reviewed with the patient.  Questions have been answered.  All parties agreeable.   Wyline Mood, MD  02/22/2023, 8:51 AM

## 2023-02-22 NOTE — Anesthesia Preprocedure Evaluation (Signed)
Anesthesia Evaluation  Patient identified by MRN, date of birth, ID band Patient awake    Reviewed: Allergy & Precautions, NPO status , Patient's Chart, lab work & pertinent test results  Airway Mallampati: III  TM Distance: >3 FB Neck ROM: full    Dental  (+) Chipped, Dental Advidsory Given   Pulmonary neg COPD, former smoker   Pulmonary exam normal        Cardiovascular (-) angina (-) Past MI and (-) CABG negative cardio ROS Normal cardiovascular exam     Neuro/Psych  PSYCHIATRIC DISORDERS      negative neurological ROS     GI/Hepatic negative GI ROS, Neg liver ROS,,,  Endo/Other  Hypothyroidism    Renal/GU negative Renal ROS  negative genitourinary   Musculoskeletal   Abdominal   Peds  Hematology negative hematology ROS (+)   Anesthesia Other Findings Past Medical History: No date: Colon polyps No date: Depression No date: Hypothyroidism No date: Urine incontinence No date: UTI (urinary tract infection)  Past Surgical History: No date: BREAST BIOPSY; Left No date: CHOLECYSTECTOMY No date: MOUTH SURGERY     Reproductive/Obstetrics negative OB ROS                             Anesthesia Physical Anesthesia Plan  ASA: 2  Anesthesia Plan: General   Post-op Pain Management: Minimal or no pain anticipated   Induction: Intravenous  PONV Risk Score and Plan: 3 and Propofol infusion, TIVA and Ondansetron  Airway Management Planned: Nasal Cannula  Additional Equipment: None  Intra-op Plan:   Post-operative Plan:   Informed Consent: I have reviewed the patients History and Physical, chart, labs and discussed the procedure including the risks, benefits and alternatives for the proposed anesthesia with the patient or authorized representative who has indicated his/her understanding and acceptance.     Dental advisory given  Plan Discussed with: CRNA and  Surgeon  Anesthesia Plan Comments: (Discussed risks of anesthesia with patient, including possibility of difficulty with spontaneous ventilation under anesthesia necessitating airway intervention, PONV, and rare risks such as cardiac or respiratory or neurological events, and allergic reactions. Discussed the role of CRNA in patient's perioperative care. Patient understands.)       Anesthesia Quick Evaluation

## 2023-02-22 NOTE — Anesthesia Postprocedure Evaluation (Signed)
Anesthesia Post Note  Patient: KALIOPE QUINONEZ  Procedure(s) Performed: COLONOSCOPY WITH PROPOFOL  Patient location during evaluation: Endoscopy Anesthesia Type: General Level of consciousness: awake and alert Pain management: pain level controlled Vital Signs Assessment: post-procedure vital signs reviewed and stable Respiratory status: spontaneous breathing, nonlabored ventilation, respiratory function stable and patient connected to nasal cannula oxygen Cardiovascular status: blood pressure returned to baseline and stable Postop Assessment: no apparent nausea or vomiting Anesthetic complications: no  No notable events documented.   Last Vitals:  Vitals:   02/22/23 0925 02/22/23 0935  BP: 110/77 122/66  Pulse: 63 (!) 54  Resp: 15 15  Temp:    SpO2: 97% 99%    Last Pain:  Vitals:   02/22/23 0935  TempSrc:   PainSc: 0-No pain                 Stephanie Coup

## 2023-02-24 ENCOUNTER — Telehealth: Payer: Self-pay

## 2023-02-24 ENCOUNTER — Encounter: Payer: Self-pay | Admitting: Gastroenterology

## 2023-02-24 DIAGNOSIS — Z1211 Encounter for screening for malignant neoplasm of colon: Secondary | ICD-10-CM

## 2023-02-24 NOTE — Telephone Encounter (Signed)
Pt had a colonoscopy done on Monday and was not cleaned out. Dr. Tobi Bastos recommended her to repeat in 2 weeks. Please call pt to reschedule. This was originally a screening. Pt available anytime of the day, but prefers an afternoon call.

## 2023-03-01 ENCOUNTER — Other Ambulatory Visit: Payer: Self-pay

## 2023-03-01 DIAGNOSIS — Z1211 Encounter for screening for malignant neoplasm of colon: Secondary | ICD-10-CM

## 2023-03-01 MED ORDER — GOLYTELY 236 G PO SOLR: 4000.0000 mL | Freq: Two times a day (BID) | ORAL | 0 refills | Status: AC

## 2023-03-01 NOTE — Addendum Note (Signed)
Addended by: Avie Arenas on: 03/01/2023 09:09 AM   Modules accepted: Orders

## 2023-03-01 NOTE — Telephone Encounter (Signed)
Repeat colonoscopy has been rescheduled with Dr. Tobi Bastos on 04/05/23.  2 day prep using Golytely instructions explained as follows:  04/02/23- until Noon on 04/03/23 Low Fiber Diet 04/03/23 at Noon start Clear liquid diet 06/01//24 at 5pm Mix 1 jug of Golytely prep with clear liquid.  Drink 8 oz every 15-30 mins until half has been completed. Stop Penentermine on 04/04/23 04/04/23 continue Clear Liquid Diet.  At 5pm resume drinking the remainder of Golytely prep drinking 8 oz every 15-30 mins until entire contents have been completed. 04/05/23 5 hours before procedure Mix 2nd Golytely prep with clear liquid.  Drink 8 oz every 15-30 mins until half has been completed.   Do Not Eat or Drink Anything 4 hours prior to colonoscopy.

## 2023-04-02 ENCOUNTER — Encounter: Payer: Self-pay | Admitting: Gastroenterology

## 2023-04-05 ENCOUNTER — Encounter
Admission: RE | Disposition: A | Discharge status: Home / Self Care | Payer: Self-pay | Source: Home / Self Care | Attending: Gastroenterology

## 2023-04-05 ENCOUNTER — Ambulatory Visit: Payer: Medicare Other | Admitting: Anesthesiology

## 2023-04-05 ENCOUNTER — Ambulatory Visit
Admission: RE | Admit: 2023-04-05 | Discharge: 2023-04-05 | Disposition: A | Discharge status: Home / Self Care | Payer: Medicare Other | Attending: Gastroenterology | Admitting: Gastroenterology

## 2023-04-05 DIAGNOSIS — Z800 Family history of malignant neoplasm of digestive organs: Secondary | ICD-10-CM

## 2023-04-05 DIAGNOSIS — Z1211 Encounter for screening for malignant neoplasm of colon: Secondary | ICD-10-CM | POA: Insufficient documentation

## 2023-04-05 DIAGNOSIS — F32A Depression, unspecified: Secondary | ICD-10-CM | POA: Insufficient documentation

## 2023-04-05 DIAGNOSIS — Z87891 Personal history of nicotine dependence: Secondary | ICD-10-CM | POA: Diagnosis not present

## 2023-04-05 DIAGNOSIS — E039 Hypothyroidism, unspecified: Secondary | ICD-10-CM | POA: Insufficient documentation

## 2023-04-05 HISTORY — PX: COLONOSCOPY WITH PROPOFOL: SHX5780

## 2023-04-05 SURGERY — COLONOSCOPY WITH PROPOFOL
Anesthesia: General

## 2023-04-05 MED ORDER — PROPOFOL 500 MG/50ML IV EMUL
INTRAVENOUS | Status: DC | PRN
  Administered 2023-04-05: 120 ug/kg/min via INTRAVENOUS

## 2023-04-05 MED ORDER — SIMETHICONE 40 MG/0.6ML PO SUSP
ORAL | Status: DC | PRN
  Administered 2023-04-05: 120 mL

## 2023-04-05 MED ORDER — LIDOCAINE HCL (CARDIAC) PF 100 MG/5ML IV SOSY
PREFILLED_SYRINGE | INTRAVENOUS | Status: DC | PRN
  Administered 2023-04-05: 50 mg via INTRAVENOUS

## 2023-04-05 MED ORDER — PROPOFOL 10 MG/ML IV BOLUS
INTRAVENOUS | Status: DC | PRN
  Administered 2023-04-05: 100 mg via INTRAVENOUS

## 2023-04-05 MED ORDER — SODIUM CHLORIDE 0.9 % IV SOLN: INTRAVENOUS | Status: DC

## 2023-04-05 NOTE — Anesthesia Preprocedure Evaluation (Addendum)
Anesthesia Evaluation  Patient identified by MRN, date of birth, ID band Patient awake    Reviewed: Allergy & Precautions, NPO status , Patient's Chart, lab work & pertinent test results  Airway Mallampati: III  TM Distance: >3 FB Neck ROM: full    Dental  (+) Dental Advidsory Given, Partial Lower   Pulmonary neg COPD, former smoker   Pulmonary exam normal        Cardiovascular (-) angina (-) Past MI and (-) CABG negative cardio ROS Normal cardiovascular exam     Neuro/Psych  PSYCHIATRIC DISORDERS  Depression    negative neurological ROS     GI/Hepatic negative GI ROS, Neg liver ROS,,,  Endo/Other  Hypothyroidism    Renal/GU negative Renal ROS  negative genitourinary   Musculoskeletal   Abdominal   Peds  Hematology negative hematology ROS (+)   Anesthesia Other Findings Past Medical History: No date: Colon polyps No date: Depression No date: Hypothyroidism No date: Urine incontinence No date: UTI (urinary tract infection)  Past Surgical History: No date: BREAST BIOPSY; Left No date: CHOLECYSTECTOMY No date: MOUTH SURGERY     Reproductive/Obstetrics negative OB ROS                             Anesthesia Physical Anesthesia Plan  ASA: 2  Anesthesia Plan: General   Post-op Pain Management: Minimal or no pain anticipated   Induction: Intravenous  PONV Risk Score and Plan: 3 and Propofol infusion, TIVA and Ondansetron  Airway Management Planned: Nasal Cannula  Additional Equipment: None  Intra-op Plan:   Post-operative Plan:   Informed Consent: I have reviewed the patients History and Physical, chart, labs and discussed the procedure including the risks, benefits and alternatives for the proposed anesthesia with the patient or authorized representative who has indicated his/her understanding and acceptance.     Dental advisory given  Plan Discussed with: CRNA and  Surgeon  Anesthesia Plan Comments:        Anesthesia Quick Evaluation

## 2023-04-05 NOTE — Anesthesia Postprocedure Evaluation (Signed)
Anesthesia Post Note  Patient: Carolyn Shea  Procedure(s) Performed: COLONOSCOPY WITH PROPOFOL  Patient location during evaluation: Endoscopy Anesthesia Type: General Level of consciousness: awake and alert Pain management: pain level controlled Vital Signs Assessment: post-procedure vital signs reviewed and stable Respiratory status: spontaneous breathing, nonlabored ventilation and respiratory function stable Cardiovascular status: blood pressure returned to baseline and stable Postop Assessment: no apparent nausea or vomiting Anesthetic complications: no   No notable events documented.   Last Vitals:  Vitals:   04/05/23 1010 04/05/23 1020  BP: 127/74 133/74  Pulse: 60 (!) 57  Resp: 14 13  Temp:    SpO2: 98% 100%    Last Pain:  Vitals:   04/05/23 1020  TempSrc:   PainSc: 0-No pain                 Foye Deer

## 2023-04-05 NOTE — Op Note (Signed)
[facility] Patient Name: Carolyn Shea Procedure Date: 04/05/2023 9:19 AM MRN: [id number] Account #: [id number] Date of Birth: 09/10/1956 Admit Type: Outpatient Age: 67 Room: [facility] ENDO ROOM 1 Gender: Female Note Status: Finalized Instrument Name: Prentice Docker [id number] Procedure:             Colonoscopy Indications:           Screening in patient at increased risk: Family history                         of 1st-degree relative with colorectal cancer Providers:             Wyline Mood MD, MD Referring MD:          Bethanie Dicker Medicines:             Monitored Anesthesia Care Complications:         No immediate complications. Procedure:             Pre-Anesthesia Assessment:                        - Prior to the procedure, a History and Physical was                         performed, and patient medications, allergies and                         sensitivities were reviewed. The patient's tolerance                         of previous anesthesia was reviewed.                        - The risks and benefits of the procedure and the                         sedation options and risks were discussed with the                         patient. All questions were answered and informed                         consent was obtained.                        - The risks and benefits of the procedure and the                         sedation options and risks were discussed with the                         patient. All questions were answered and informed                         consent was obtained.                        After obtaining informed consent, the colonoscope was  passed under direct vision. Throughout the procedure,                         the patient's blood pressure, pulse, and oxygen                         saturations were monitored continuously. The                         Colonoscope was introduced through the anus and                          advanced to the the cecum, identified by the                         appendiceal orifice. The colonoscopy was performed                         with ease. The patient tolerated the procedure well.                         The quality of the bowel preparation was good. The                         ileocecal valve, appendiceal orifice, and rectum were                         photographed. Findings:      The perianal and digital rectal examinations were normal.      The entire examined colon appeared normal on direct and retroflexion       views. Impression:            - The entire examined colon is normal on direct and                         retroflexion views.                        - No specimens collected. Recommendation:        - Discharge patient to home.                        - Resume previous diet.                        - Continue present medications.                        - Repeat colonoscopy in 5 years for screening purposes. Procedure Code(s):     --- Professional ---                        [phone], Colonoscopy, flexible; diagnostic, including                         collection of specimen(s) by brushing or washing, when                         performed (separate procedure) Diagnosis Code(s):     --- Professional ---  Z80.0, Family history of malignant neoplasm of                         digestive organs CPT copyright 2022 American Medical Association. All rights reserved. The codes documented in this report are preliminary and upon coder review may  be revised to meet current compliance requirements. Wyline Mood, MD Wyline Mood MD, MD 04/05/2023 10:01:20 AM This report has been signed electronically. Number of Addenda: 0 Note Initiated On: 04/05/2023 9:19 AM Scope Withdrawal Time: 0 hours 11 minutes 52 seconds  Total Procedure Duration: 0 hours 18 minutes 57 seconds  Estimated Blood Loss:  Estimated blood loss: none.      [facility]

## 2023-04-05 NOTE — Transfer of Care (Signed)
Immediate Anesthesia Transfer of Care Note  Patient: Carolyn Shea  Procedure(s) Performed: COLONOSCOPY WITH PROPOFOL  Patient Location: PACU and Endoscopy Unit  Anesthesia Type:General  Level of Consciousness: drowsy and patient cooperative  Airway & Oxygen Therapy: Patient Spontanous Breathing  Post-op Assessment: Report given to RN and Post -op Vital signs reviewed and stable  Post vital signs: Reviewed and stable  Last Vitals:  Vitals Value Taken Time  BP 102/69 04/05/23 1000  Temp    Pulse 65 04/05/23 1000  Resp 17 04/05/23 1000  SpO2 100 % 04/05/23 1000    Last Pain:  Vitals:   04/05/23 1000  TempSrc:   PainSc: Asleep         Complications: No notable events documented.

## 2023-04-05 NOTE — H&P (Signed)
Wyline Mood, MD [address], [address], [place], [place], [address] [address], [place], [place], [address] Phone: [phone]  Fax: [phone]  Primary Care Physician:  Bethanie Dicker, NP   Pre-Procedure History & Physical: HPI:  Carolyn Shea is a 67 y.o. female is here for an colonoscopy.   Past Medical History:  Diagnosis Date   Colon polyps    Depression    Hypothyroidism    Urine incontinence    UTI (urinary tract infection)     Past Surgical History:  Procedure Laterality Date   BREAST BIOPSY Left    CHOLECYSTECTOMY     COLONOSCOPY WITH PROPOFOL N/A 02/22/2023   Procedure: COLONOSCOPY WITH PROPOFOL;  Surgeon: Wyline Mood, MD;  Location: [facility] ENDOSCOPY;  Service: Gastroenterology;  Laterality: N/A;   MOUTH SURGERY      Prior to Admission medications   Medication Sig Start Date End Date Taking? Authorizing Provider  Cholecalciferol (VITAMIN D) 50 MCG (2000 UT) CAPS Take by mouth.    [provider]  cyanocobalamin (VITAMIN B12) 500 MCG tablet Take 1,000 mcg by mouth. 11/29/15   [provider]  DULoxetine (CYMBALTA) 20 MG capsule Take 1 capsule (20 mg total) by mouth daily. 01/20/23   Bethanie Dicker, NP  Flaxseed, Linseed, (FLAX SEED OIL) 1000 MG CAPS Take by mouth. 02/20/16   [provider]  glucosamine-chondroitin 500-400 MG tablet Take by mouth. 11/29/15   [provider]  levothyroxine (SYNTHROID) 50 MCG tablet Take 1 tablet (50 mcg total) by mouth daily before breakfast. 01/20/23   Bethanie Dicker, NP  Multiple Vitamin (MULTIVITAMIN) capsule Take 1 capsule by mouth daily.    [provider]  Phentermine HCl 8 MG TABS Take 1 tablet by mouth in the morning, at noon, and at bedtime.    [provider]  traZODone (DESYREL) 100 MG tablet Take 1 tablet (100 mg total) by mouth at bedtime. 01/20/23   Bethanie Dicker, NP    Allergies as of 03/01/2023 - Review Complete 02/22/2023  Allergen Reaction Noted    Ciprofloxacin Palpitations 02/02/2015    Family History  Problem Relation Age of Onset   COPD Mother    Cancer Mother    Diabetes Sister    Depression Sister    COPD Sister    Cancer Sister    Mental illness Sister    Cancer Sister    COPD Sister    Depression Sister    Diabetes Sister    Asthma Son    Heart disease Maternal Grandmother    Diabetes Maternal Grandmother    Heart disease Maternal Grandfather    COPD Maternal Grandfather    Cancer Maternal Grandfather     Social History   Socioeconomic History   Marital status: Unknown    Spouse name: Not on file   Number of children: Not on file   Years of education: Not on file   Highest education level: Not on file  Occupational History   Not on file  Tobacco Use   Smoking status: Former    Types: Cigarettes   Smokeless tobacco: Never  Vaping Use   Vaping Use: Never used  Substance and Sexual Activity   Alcohol use: Not Currently   Drug use: Never   Sexual activity: Not Currently  Other Topics Concern   Not on file  Social History Narrative   Not on file   Social Determinants of Health   Financial Resource Strain: Not on file  Food Insecurity: Not on file  Transportation Needs: Not on file  Physical Activity: Not on file  Stress: Not on file  Social Connections: Not on file  Intimate Partner Violence: Not on file    Review of Systems: See HPI, otherwise negative ROS  Physical Exam: There were no vitals taken for this visit. General:   Alert,  pleasant and cooperative in NAD Head:  Normocephalic and atraumatic. Neck:  Supple; no masses or thyromegaly. Lungs:  Clear throughout to auscultation, normal respiratory effort.    Heart:  +S1, +S2, Regular rate and rhythm, No edema. Abdomen:  Soft, nontender and nondistended. Normal bowel sounds, without guarding, and without rebound.   Neurologic:  Alert and  oriented x4;  grossly normal neurologically.  Impression/Plan: Carolyn Shea is here for  an colonoscopy to be performed for family history of colon cancer Risks, benefits, limitations, and alternatives regarding  colonoscopy have been reviewed with the patient.  Questions have been answered.  All parties agreeable.   Wyline Mood, MD  04/05/2023, 9:12 AM

## 2023-04-06 ENCOUNTER — Encounter: Payer: Self-pay | Admitting: Gastroenterology

## 2023-06-02 DIAGNOSIS — H0289 Other specified disorders of eyelid: Secondary | ICD-10-CM | POA: Diagnosis not present

## 2023-06-02 DIAGNOSIS — H26493 Other secondary cataract, bilateral: Secondary | ICD-10-CM | POA: Diagnosis not present

## 2023-06-02 DIAGNOSIS — H04123 Dry eye syndrome of bilateral lacrimal glands: Secondary | ICD-10-CM | POA: Diagnosis not present

## 2023-06-02 DIAGNOSIS — H35372 Puckering of macula, left eye: Secondary | ICD-10-CM | POA: Diagnosis not present

## 2023-06-14 ENCOUNTER — Telehealth: Payer: Self-pay

## 2023-06-14 NOTE — Telephone Encounter (Signed)
Medical clarification request received in regards to the change of manufacturer of the generic LEVOTHYROXINE it is changing from [facility] to [facility] and they would like to know if it is okay with PCP   Paper has been placed in provider to be signed folder

## 2023-06-15 NOTE — Telephone Encounter (Signed)
Form has been faxed to given fax number [phone] with a completed transmission log

## 2023-07-07 ENCOUNTER — Ambulatory Visit (INDEPENDENT_AMBULATORY_CARE_PROVIDER_SITE_OTHER): Payer: Medicare Other | Admitting: *Deleted

## 2023-07-07 VITALS — Ht 62.0 in | Wt 145.0 lb

## 2023-07-07 DIAGNOSIS — Z0000 Encounter for general adult medical examination without abnormal findings: Secondary | ICD-10-CM | POA: Diagnosis not present

## 2023-07-07 NOTE — Patient Instructions (Signed)
Ms. Arocha , Thank you for taking time to come for your Medicare Wellness Visit. I appreciate your ongoing commitment to your health goals. Please review the following plan we discussed and let me know if I can assist you in the future.   Referrals/Orders/Follow-Ups/Clinician Recommendations: None  This is a list of the screening recommended for you and due dates:  Health Maintenance  Topic Date Due   Flu Shot  06/03/2023   COVID-19 Vaccine (1 - 2023-24 season) Never done   DEXA scan (bone density measurement)  01/20/2024*   Hepatitis C Screening  01/20/2024*   DTaP/Tdap/Td vaccine (2 - Td or Tdap) 12/09/2023   Medicare Annual Wellness Visit  07/06/2024   Mammogram  02/03/2025   Colon Cancer Screening  04/04/2028   Pneumonia Vaccine  Completed   Zoster (Shingles) Vaccine  Completed   HPV Vaccine  Aged Out  *Topic was postponed. The date shown is not the original due date.    Advanced directives: (Copy Requested) Please bring a copy of your health care power of attorney and living will to the office to be added to your chart at your convenience.   Next Medicare Annual Wellness Visit scheduled for next year: Yes 07/10/24 @ 11:15

## 2023-07-07 NOTE — Progress Notes (Signed)
Subjective:   Carolyn Shea is a 67 y.o. female who presents for an Initial Medicare Annual Wellness Visit.  Visit Complete: Virtual  I connected with  Carolyn Shea on 07/07/23 by a audio enabled telemedicine application and verified that I am speaking with the correct person using two identifiers.  Patient Location: Home  Provider Location: Office/Clinic  I discussed the limitations of evaluation and management by telemedicine. The patient expressed understanding and agreed to proceed.  Patient Medicare AWV questionnaire was completed by the patient on 07/05/23; I have confirmed that all information answered by patient is correct and no changes since this date.  Vital Signs: Unable to obtain new vitals due to this being a telehealth visit.   Review of Systems     Cardiac Risk Factors include: dyslipidemia;advanced age (>72men, >21 women)     Objective:    Today's Vitals   07/07/23 1120  Weight: 145 lb (65.8 kg)  Height: 5\' 2"  (1.575 m)   Body mass index is 26.52 kg/m.     07/07/2023   11:33 AM 04/05/2023    9:19 AM  Advanced Directives  Does Patient Have a Medical Advance Directive? Yes No;Yes  Type of Estate agent of [place];Living will Living will  Copy of Healthcare Power of Attorney in Chart? No - copy requested     Current Medications (verified) Outpatient Encounter Medications as of 07/07/2023  Medication Sig   Calcium-Vitamin D-Vitamin K (VIACTIV CALCIUM PLUS D) 650-12.5-40 MG-MCG-MCG CHEW Chew by mouth. Chew two daily   Cholecalciferol (VITAMIN D) 50 MCG (2000 UT) CAPS Take by mouth.   Cranberry 4200 MG CAPS Take by mouth. Take two by mouth daily   cyanocobalamin (VITAMIN B12) 500 MCG tablet Take 1,000 mcg by mouth.   DULoxetine (CYMBALTA) 20 MG capsule Take 1 capsule (20 mg total) by mouth daily.   estradiol (ESTRACE) 0.1 MG/GM vaginal cream Place 1 Applicatorful vaginally once a week.   Flaxseed, Linseed, (FLAX SEED OIL)  1000 MG CAPS Take by mouth.   glucosamine-chondroitin 500-400 MG tablet Take by mouth.   levothyroxine (SYNTHROID) 50 MCG tablet Take 1 tablet (50 mcg total) by mouth daily before breakfast.   Multiple Vitamin (MULTIVITAMIN) capsule Take 1 capsule by mouth daily.   Phentermine HCl 8 MG TABS Take 1 tablet by mouth in the morning, at noon, and at bedtime.   traZODone (DESYREL) 100 MG tablet Take 1 tablet (100 mg total) by mouth at bedtime.   No facility-administered encounter medications on file as of 07/07/2023.    Allergies (verified) Ciprofloxacin   History: Past Medical History:  Diagnosis Date   Colon polyps    Depression    Hypothyroidism    Urine incontinence    UTI (urinary tract infection)    Past Surgical History:  Procedure Laterality Date   BREAST BIOPSY Left    CHOLECYSTECTOMY     COLONOSCOPY WITH PROPOFOL N/A 02/22/2023   Procedure: COLONOSCOPY WITH PROPOFOL;  Surgeon: Wyline Mood, MD;  Location: [facility] ENDOSCOPY;  Service: Gastroenterology;  Laterality: N/A;   COLONOSCOPY WITH PROPOFOL N/A 04/05/2023   Procedure: COLONOSCOPY WITH PROPOFOL;  Surgeon: Wyline Mood, MD;  Location: [facility] ENDOSCOPY;  Service: Gastroenterology;  Laterality: N/A;   MOUTH SURGERY     Family History  Problem Relation Age of Onset   COPD Mother    Cancer Mother    Diabetes Sister    Depression Sister    COPD Sister    Cancer Sister  Mental illness Sister    Cancer Sister    COPD Sister    Depression Sister    Diabetes Sister    Asthma Son    Heart disease Maternal Grandmother    Diabetes Maternal Grandmother    Heart disease Maternal Grandfather    COPD Maternal Grandfather    Cancer Maternal Grandfather    Social History   Socioeconomic History   Marital status: Unknown    Spouse name: Not on file   Number of children: Not on file   Years of education: Not on file   Highest education level: Not on file  Occupational History   Not on file  Tobacco Use   Smoking status:  Former    Types: Cigarettes   Smokeless tobacco: Never  Vaping Use   Vaping status: Never Used  Substance and Sexual Activity   Alcohol use: Not Currently   Drug use: Never   Sexual activity: Not Currently  Other Topics Concern   Not on file  Social History Narrative   divorced   Social Determinants of Health   Financial Resource Strain: Low Risk  (07/05/2023)   Overall Financial Resource Strain (CARDIA)    Difficulty of Paying Living Expenses: Not hard at all  Food Insecurity: No Food Insecurity (07/05/2023)   Hunger Vital Sign    Worried About Running Out of Food in the Last Year: Never true    Ran Out of Food in the Last Year: Never true  Transportation Needs: No Transportation Needs (07/05/2023)   PRAPARE - Administrator, Civil Service (Medical): No    Lack of Transportation (Non-Medical): No  Physical Activity: Insufficiently Active (07/05/2023)   Exercise Vital Sign    Days of Exercise per Week: 2 days    Minutes of Exercise per Session: 20 min  Stress: No Stress Concern Present (07/05/2023)   [employer] of Occupational Health - Occupational Stress Questionnaire    Feeling of Stress : Not at all  Social Connections: Moderately Isolated (07/05/2023)   Social Connection and Isolation Panel [NHANES]    Frequency of Communication with Friends and Family: Once a week    Frequency of Social Gatherings with Friends and Family: Once a week    Attends Religious Services: More than 4 times per year    Active Member of [employer] or Organizations: Yes    Attends Engineer, structural: More than 4 times per year    Marital Status: Divorced    Tobacco Counseling Counseling given: Not Answered   Clinical Intake:  Pre-visit preparation completed: Yes  Pain : No/denies pain     BMI - recorded: 26.52 Nutritional Status: BMI 25 -29 Overweight Nutritional Risks: None Diabetes: No  How often do you need to have someone help you when you read instructions,  pamphlets, or other written materials from your doctor or pharmacy?: 1 - Never  Interpreter Needed?: No  Comments: R. Omarr Hann LPN   Activities of Daily Living    07/05/2023    5:38 AM  In your present state of health, do you have any difficulty performing the following activities:  Hearing? 0  Vision? 0  Difficulty concentrating or making decisions? 0  Walking or climbing stairs? 0  Dressing or bathing? 0  Doing errands, shopping? 0  Preparing Food and eating ? N  Using the Toilet? N  In the past six months, have you accidently leaked urine? Y  Do you have problems with loss of bowel control? N  Managing your Medications? N  Managing your Finances? N  Housekeeping or managing your Housekeeping? N    Patient Care Team: Bethanie Dicker, NP as PCP - General (Nurse Practitioner)  Indicate any recent Medical Services you may have received from other than [facility] providers in the past year (date may be approximate).     Assessment:   This is a routine wellness examination for [place].  Hearing/Vision screen Hearing Screening - Comments:: No issues Vision Screening - Comments:: glasses  Dietary issues and exercise activities discussed:     Goals Addressed             This Visit's Progress    Patient Stated       Try to exercise       Depression Screen    07/07/2023   11:29 AM 01/20/2023    1:23 PM 01/20/2023    1:20 PM  PHQ 2/9 Scores  PHQ - 2 Score 0 0 0  PHQ- 9 Score 0 0     Fall Risk    07/05/2023    5:38 AM 01/20/2023    1:19 PM  Fall Risk   Falls in the past year? 0 0  Number falls in past yr: 0 0  Injury with Fall? 0 0  Risk for fall due to : No Fall Risks No Fall Risks  Follow up Falls prevention discussed;Falls evaluation completed Falls evaluation completed    MEDICARE RISK AT HOME: Medicare Risk at Home Any stairs in or around the home?: No Home free of loose throw rugs in walkways, pet beds, electrical cords, etc?: Yes Adequate lighting in your home  to reduce risk of falls?: Yes Life alert?: No Use of a cane, walker or w/c?: No Grab bars in the bathroom?: No Shower chair or bench in shower?: No Elevated toilet seat or a handicapped toilet?: No     Cognitive Function:        07/07/2023   11:33 AM  6CIT Screen  What Year? 0 points  What month? 0 points  What time? 0 points  Count back from 20 0 points  Months in reverse 0 points  Repeat phrase 0 points  Total Score 0 points    Immunizations Immunization History  Administered Date(s) Administered   Fluad Quad(high Dose 65+) 08/05/2022   Influenza, High Dose Seasonal PF 08/05/2022   Influenza, Quadrivalent, Recombinant, Inj, Pf 08/05/2018   Influenza,inj,Quad PF,6+ Mos 08/04/2017, 06/26/2019, 07/05/2020, 07/11/2021   Influenza-Unspecified 08/08/2016, 08/04/2017, 08/05/2018   PNEUMOCOCCAL CONJUGATE-20 08/05/2022   Tdap 12/08/2013   Zoster Recombinant(Shingrix) 07/05/2020, 01/14/2021    TDAP status: Up to date  Flu Vaccine status: Up to date  Pneumococcal vaccine status: Up to date  Covid-19 vaccine status: Information provided on how to obtain vaccines.   Qualifies for Shingles Vaccine? Yes   Zostavax completed No   Shingrix Completed?: Yes  Screening Tests Health Maintenance  Topic Date Due   Medicare Annual Wellness (AWV)  Never done   INFLUENZA VACCINE  06/03/2023   COVID-19 Vaccine (1 - 2023-24 season) Never done   DEXA SCAN  01/20/2024 (Originally 06/18/2022)   Hepatitis C Screening  01/20/2024 (Originally 06/19/1974)   DTaP/Tdap/Td (2 - Td or Tdap) 12/09/2023   MAMMOGRAM  02/03/2025   Colonoscopy  04/04/2033   Pneumonia Vaccine 70+ Years old  Completed   Zoster Vaccines- Shingrix  Completed   HPV VACCINES  Aged Out    Health Maintenance  Health Maintenance Due  Topic Date Due   Medicare  Annual Wellness (AWV)  Never done   INFLUENZA VACCINE  06/03/2023   COVID-19 Vaccine (1 - 2023-24 season) Never done    Colorectal cancer screening: Type of  screening: Colonoscopy. Completed 3/24. Repeat every 5 years  Mammogram status: Completed 4/24. Repeat every year  Bone Density status: Completed 2022. Results reflect: Bone density results: OSTEOPENIA. Repeat every 2 years. Patient will get report  Lung Cancer Screening: (Low Dose CT Chest recommended if Age 30-80 years, 20 pack-year currently smoking OR have quit w/in 15years.) does not qualify.     Additional Screening:  Hepatitis C Screening: does qualify; Completed Will discuss with PCP at next office visit  Vision Screening: Recommended annual ophthalmology exams for early detection of glaucoma and other disorders of the eye. Is the patient up to date with their annual eye exam?  Yes  Who is the provider or what is the name of the office in which the patient attends annual eye exams? [facility] If pt is not established with a provider, would they like to be referred to a provider to establish care? No .   Dental Screening: Recommended annual dental exams for proper oral hygiene    Community Resource Referral / Chronic Care Management: CRR required this visit?  No   CCM required this visit?  No     Plan:     I have personally reviewed and noted the following in the patient's chart:   Medical and social history Use of alcohol, tobacco or illicit drugs  Current medications and supplements including opioid prescriptions. Patient is not currently taking opioid prescriptions. Functional ability and status Nutritional status Physical activity Advanced directives List of other physicians Hospitalizations, surgeries, and ER visits in previous 12 months Vitals Screenings to include cognitive, depression, and falls Referrals and appointments  In addition, I have reviewed and discussed with patient certain preventive protocols, quality metrics, and best practice recommendations. A written personalized care plan for preventive services as well as general preventive health  recommendations were provided to patient.     Sydell Axon, LPN   11/07/1094   After Visit Summary: (MyChart) Due to this being a telephonic visit, the after visit summary with patients personalized plan was offered to patient via MyChart   Nurse Notes: None

## 2023-07-13 ENCOUNTER — Telehealth (INDEPENDENT_AMBULATORY_CARE_PROVIDER_SITE_OTHER): Payer: Medicare Other | Admitting: Physician Assistant

## 2023-07-13 ENCOUNTER — Encounter: Payer: Self-pay | Admitting: Physician Assistant

## 2023-07-13 ENCOUNTER — Telehealth: Payer: Medicare Other | Admitting: Family

## 2023-07-13 DIAGNOSIS — U071 COVID-19: Secondary | ICD-10-CM | POA: Diagnosis not present

## 2023-07-13 MED ORDER — NIRMATRELVIR/RITONAVIR (PAXLOVID)TABLET: 3.0000 | ORAL_TABLET | Freq: Two times a day (BID) | ORAL | 0 refills | Status: AC

## 2023-07-13 NOTE — Progress Notes (Signed)
Virtual Visit via Video   I connected with Carolyn Shea on 07/13/23 at 10:20 AM EDT by a video enabled telemedicine application and verified that I am speaking with the correct person using two identifiers. Location patient: Home Location provider:  [facility], Office Persons participating in the virtual visit: Carolyn Shea, [place] PA-C  I discussed the limitations of evaluation and management by telemedicine and the availability of in person appointments. The patient expressed understanding and agreed to proceed.  Subjective:   HPI:   COVID-19 Positive Symptom onset: Saturday had a bad headache(s)  Travel/contacts: someone in her house has tested positive last week Vaccination status: last vaccine last Oct Testing results: today  Had COVID in Nov 2020 -- had a much worse illness than today's illness. Did not require hospitalization.   Patient endorses the following symptoms: headache(s), nasal congestion, fatigue, body aches  Patient denies the following symptoms: chest pain, SOB  Denies history of asthma/copd  Treatments tried: OTC (available over the counter without a prescription) cold medication  ROS: See pertinent positives and negatives per HPI.  Patient Active Problem List   Diagnosis Date Noted   Family history of colon cancer 02/22/2023   Osteopenia determined by x-ray 07/05/2020   Mild episode of recurrent major depressive disorder (HCC) 02/23/2019   Neuralgia 02/20/2016   Left posterior capsular opacification 02/17/2016   Pseudophakia of both eyes 01/14/2016   Hypothyroidism 12/16/2015   Insomnia 12/16/2015   Mixed hyperlipidemia 12/16/2015    Social History   Tobacco Use   Smoking status: Former    Types: Cigarettes   Smokeless tobacco: Never  Substance Use Topics   Alcohol use: Not Currently    Current Outpatient Medications:    nirmatrelvir/ritonavir (PAXLOVID) 20 x 150 MG & 10 x 100MG  TABS, Take 3 tablets by mouth 2  (two) times daily for 5 days. (Take nirmatrelvir 150 mg two tablets twice daily for 5 days and ritonavir 100 mg one tablet twice daily for 5 days), Disp: 30 tablet, Rfl: 0   Calcium-Vitamin D-Vitamin K (VIACTIV CALCIUM PLUS D) 650-12.5-40 MG-MCG-MCG CHEW, Chew by mouth. Chew two daily, Disp: , Rfl:    Cholecalciferol (VITAMIN D) 50 MCG (2000 UT) CAPS, Take by mouth., Disp: , Rfl:    Cranberry 4200 MG CAPS, Take by mouth. Take two by mouth daily, Disp: , Rfl:    cyanocobalamin (VITAMIN B12) 500 MCG tablet, Take 1,000 mcg by mouth., Disp: , Rfl:    DULoxetine (CYMBALTA) 20 MG capsule, Take 1 capsule (20 mg total) by mouth daily., Disp: 90 capsule, Rfl: 3   estradiol (ESTRACE) 0.1 MG/GM vaginal cream, Place 1 Applicatorful vaginally once a week., Disp: , Rfl:    Flaxseed, Linseed, (FLAX SEED OIL) 1000 MG CAPS, Take by mouth., Disp: , Rfl:    glucosamine-chondroitin 500-400 MG tablet, Take by mouth., Disp: , Rfl:    levothyroxine (SYNTHROID) 50 MCG tablet, Take 1 tablet (50 mcg total) by mouth daily before breakfast., Disp: 90 tablet, Rfl: 3   Multiple Vitamin (MULTIVITAMIN) capsule, Take 1 capsule by mouth daily., Disp: , Rfl:    Phentermine HCl 8 MG TABS, Take 1 tablet by mouth in the morning, at noon, and at bedtime., Disp: , Rfl:    traZODone (DESYREL) 100 MG tablet, Take 1 tablet (100 mg total) by mouth at bedtime., Disp: 90 tablet, Rfl: 3  Allergies  Allergen Reactions   Ciprofloxacin Palpitations    Patient had taken this with Cymbalta at the time.  Objective:   VITALS: Per patient if applicable, see vitals. GENERAL: Alert, appears well and in no acute distress. HEENT: Atraumatic, conjunctiva clear, no obvious abnormalities on inspection of external nose and ears. NECK: Normal movements of the head and neck. CARDIOPULMONARY: No increased WOB. Speaking in clear sentences. I:E ratio WNL.  MS: Moves all visible extremities without noticeable abnormality. PSYCH: Pleasant and cooperative,  well-groomed. Speech normal rate and rhythm. Affect is appropriate. Insight and judgement are appropriate. Attention is focused, linear, and appropriate.  NEURO: CN grossly intact. Oriented as arrived to appointment on time with no prompting. Moves both UE equally.  SKIN: No obvious lesions, wounds, erythema, or cyanosis noted on face or hands.  Assessment and Plan:   Carolyn Shea" was seen today for covid positive.  Diagnoses and all orders for this visit:  COVID-19  Other orders -     nirmatrelvir/ritonavir (PAXLOVID) 20 x 150 MG & 10 x 100MG  TABS; Take 3 tablets by mouth 2 (two) times daily for 5 days. (Take nirmatrelvir 150 mg two tablets twice daily for 5 days and ritonavir 100 mg one tablet twice daily for 5 days)    No red flags on discussion, patient is not in any obvious distress during our visit. Discussed progression of most viral illnesses, and recommended supportive care at this point in time.   Discussed taking Paxlovid -- reviewed risks/benefits/side effect(s)   Discussed over the counter supportive care options, including Tylenol 500 mg q 8 hours, with recommendations to push fluids and rest. Reviewed return precautions including new/worsening fever, SOB, new/worsening cough, sudden onset changes of symptoms. Recommended need to self-quarantine and practice social distancing until symptoms resolve. I recommend that patient follow-up if symptoms worsen or persist despite treatment x 7-10 days, sooner if needed.  I discussed the assessment and treatment plan with the patient. The patient was provided an opportunity to ask questions and all were answered. The patient agreed with the plan and demonstrated an understanding of the instructions.   The patient was advised to call back or seek an in-person evaluation if the symptoms worsen or if the condition fails to improve as anticipated.    [place], [place] 07/13/2023

## 2023-08-05 NOTE — Progress Notes (Signed)
Bethanie Dicker, NP-C Phone: [phone]  Carolyn Shea is a 67 y.o. female who presents today for annual exam. She has no complaints or new concerns today. She is doing well on all of her medications.   Diet: Well balanced- healthy eater, has lost 40 pounds this year with diet Exercise: None Pap smear: 2018- No longer indicated Colonoscopy: 04/05/2023- 5 year recall Mammogram: 02/04/2023 Family history-  Colon cancer: Yes, mother  Breast cancer: No  Ovarian cancer: She believes her sister, but is unsure.  Menses: Post menopausal Sexually active: No Vaccines-   Flu: Today  Tetanus: 12/08/2013  Shingles: Completed  COVID19: x 3  Pneumonia: Completed HIV screening: Declined Hep C Screening: Declined Tobacco use: No Alcohol use: No Illicit Drug use: No Dentist: Yes Ophthalmology: Yes   Social History   Tobacco Use  Smoking Status Former   Types: Cigarettes  Smokeless Tobacco Never    Current Outpatient Medications on File Prior to Visit  Medication Sig Dispense Refill   Calcium-Vitamin D-Vitamin K (VIACTIV CALCIUM PLUS D) 650-12.5-40 MG-MCG-MCG CHEW Chew by mouth. Chew two daily     Cholecalciferol (VITAMIN D) 50 MCG (2000 UT) CAPS Take by mouth.     Cranberry 4200 MG CAPS Take by mouth. Take two by mouth daily     cyanocobalamin (VITAMIN B12) 500 MCG tablet Take 1,000 mcg by mouth.     estradiol (ESTRACE) 0.1 MG/GM vaginal cream Place 1 Applicatorful vaginally once a week.     Flaxseed, Linseed, (FLAX SEED OIL) 1000 MG CAPS Take by mouth.     glucosamine-chondroitin 500-400 MG tablet Take by mouth.     Multiple Vitamin (MULTIVITAMIN) capsule Take 1 capsule by mouth daily.     Phentermine HCl 8 MG TABS Take 1 tablet by mouth in the morning, at noon, and at bedtime.     No current facility-administered medications on file prior to visit.    ROS see history of present illness  Objective  Physical Exam Vitals:   08/06/23 0805  BP: 110/80  Pulse: 62  Resp:  17  Temp: 97.7 F (36.5 C)  SpO2: 98%    BP Readings from Last 3 Encounters:  08/06/23 110/80  04/05/23 133/74  02/22/23 122/66   Wt Readings from Last 3 Encounters:  08/06/23 147 lb 4 oz (66.8 kg)  07/07/23 145 lb (65.8 kg)  04/05/23 153 lb (69.4 kg)    Physical Exam Constitutional:      General: She is not in acute distress.    Appearance: Normal appearance.  HENT:     Head: Normocephalic.     Right Ear: Tympanic membrane normal.     Left Ear: Tympanic membrane normal.     Nose: Nose normal.     Mouth/Throat:     Mouth: Mucous membranes are moist.     Pharynx: Oropharynx is clear.  Eyes:     Conjunctiva/sclera: Conjunctivae normal.     Pupils: Pupils are equal, round, and reactive to light.  Neck:     Thyroid: No thyromegaly.  Cardiovascular:     Rate and Rhythm: Normal rate and regular rhythm.     Heart sounds: Normal heart sounds.  Pulmonary:     Effort: Pulmonary effort is normal.     Breath sounds: Normal breath sounds.  Abdominal:     General: Abdomen is flat. Bowel sounds are normal.     Palpations: Abdomen is soft. There is no mass.     Tenderness: There is no abdominal tenderness.  Musculoskeletal:        General: Normal range of motion.  Lymphadenopathy:     Cervical: No cervical adenopathy.  Skin:    General: Skin is warm and dry.     Findings: No rash.  Neurological:     General: No focal deficit present.     Mental Status: She is alert.  Psychiatric:        Mood and Affect: Mood normal.        Behavior: Behavior normal.    Assessment/Plan: Please see individual problem list.  Preventative health care Assessment & Plan: Physical exam complete. Lab work as outlined. Will contact patient with results. Pap- no longer indicated. Mammogram- UTD. Colonoscopy- UTD. Flu vaccine given today in office. Tetanus vaccine- UTD. Declined additional COVID vaccines. Shingles vaccine- series completed. Pneumonia vaccines- series completed. Declined HIV and  Hep C screenings. Recommended follow ups with Dentist and Ophthalmology for annual exams. Encouraged to continue healthy diet and begin exercising. Return to care in one year, sooner PRN.   Orders: -     CBC with Differential/Platelet -     Comprehensive metabolic panel  Hypothyroidism, unspecified type Assessment & Plan: Chronic. Stable on Levothyroxine 50 mcg daily. Continue. Refills sent. Will check TSH today.   Orders: -     TSH -     Levothyroxine Sodium; Take 1 tablet (50 mcg total) by mouth daily before breakfast.  Dispense: 100 tablet; Refill: 3  Mixed hyperlipidemia Assessment & Plan: Chronic. Stable with diet control. Continue. Encouraged healthy diet and exercise. Will check fasting lipids today.   Orders: -     Lipid panel  Mild episode of recurrent major depressive disorder (HCC) Assessment & Plan: Chronic. Stable on Cymbalta 20 mg daily. Continue. Refills sent. Symptoms well managed at this time. Encouraged patient to contact if symptoms are changing or worsening.  Orders: -     DULoxetine HCl; Take 1 capsule (20 mg total) by mouth daily.  Dispense: 100 capsule; Refill: 3  Osteopenia determined by x-ray Assessment & Plan: Taking vitamin D and calcium supplement. Will check vitamin D level. Encouraged weight bearing exercises.   Orders: -     VITAMIN D 25 Hydroxy (Vit-D Deficiency, Fractures)  Insomnia, unspecified type Assessment & Plan: Chronic. Stable on Trazodone 100 mg daily at bedtime. Continue. Refills sent.   Orders: -     traZODone HCl; Take 1 tablet (100 mg total) by mouth at bedtime.  Dispense: 100 tablet; Refill: 3  Diabetes mellitus screening -     Hemoglobin A1c  Need for influenza vaccination -     Flu Vaccine Trivalent High Dose (Fluad)   Return in about 1 year (around 08/05/2024) for Annual Exam, sooner PRN.   Bethanie Dicker, NP-C [facility] - [employer]

## 2023-08-06 ENCOUNTER — Encounter: Payer: Self-pay | Admitting: Nurse Practitioner

## 2023-08-06 ENCOUNTER — Ambulatory Visit (INDEPENDENT_AMBULATORY_CARE_PROVIDER_SITE_OTHER): Payer: Medicare Other | Admitting: Nurse Practitioner

## 2023-08-06 VITALS — BP 110/80 | HR 62 | Temp 97.7°F | Resp 17 | Ht 62.0 in | Wt 147.2 lb

## 2023-08-06 DIAGNOSIS — G4700 Insomnia, unspecified: Secondary | ICD-10-CM | POA: Diagnosis not present

## 2023-08-06 DIAGNOSIS — Z131 Encounter for screening for diabetes mellitus: Secondary | ICD-10-CM

## 2023-08-06 DIAGNOSIS — F330 Major depressive disorder, recurrent, mild: Secondary | ICD-10-CM

## 2023-08-06 DIAGNOSIS — E782 Mixed hyperlipidemia: Secondary | ICD-10-CM

## 2023-08-06 DIAGNOSIS — E039 Hypothyroidism, unspecified: Secondary | ICD-10-CM | POA: Diagnosis not present

## 2023-08-06 DIAGNOSIS — Z0000 Encounter for general adult medical examination without abnormal findings: Secondary | ICD-10-CM | POA: Insufficient documentation

## 2023-08-06 DIAGNOSIS — M8580 Other specified disorders of bone density and structure, unspecified site: Secondary | ICD-10-CM

## 2023-08-06 DIAGNOSIS — Z23 Encounter for immunization: Secondary | ICD-10-CM

## 2023-08-06 LAB — CBC WITH DIFFERENTIAL/PLATELET
Basophils Absolute: 0.1 10*3/uL (ref 0.0–0.1)
Basophils Relative: 1.2 % (ref 0.0–3.0)
Eosinophils Absolute: 0.1 10*3/uL (ref 0.0–0.7)
Eosinophils Relative: 1.8 % (ref 0.0–5.0)
HCT: 37.6 % (ref 36.0–46.0)
Hemoglobin: 12.4 g/dL (ref 12.0–15.0)
Lymphocytes Relative: 38.9 % (ref 12.0–46.0)
Lymphs Abs: 1.8 10*3/uL (ref 0.7–4.0)
MCHC: 33 g/dL (ref 30.0–36.0)
MCV: 88.9 fL (ref 78.0–100.0)
Monocytes Absolute: 0.4 10*3/uL (ref 0.1–1.0)
Monocytes Relative: 9.4 % (ref 3.0–12.0)
Neutro Abs: 2.3 10*3/uL (ref 1.4–7.7)
Neutrophils Relative %: 48.7 % (ref 43.0–77.0)
Platelets: 333 10*3/uL (ref 150.0–400.0)
RBC: 4.23 Mil/uL (ref 3.87–5.11)
RDW: 13.9 % (ref 11.5–15.5)
WBC: 4.7 10*3/uL (ref 4.0–10.5)

## 2023-08-06 LAB — COMPREHENSIVE METABOLIC PANEL
ALT: 13 U/L (ref 0–35)
AST: 15 U/L (ref 0–37)
Albumin: 4.1 g/dL (ref 3.5–5.2)
Alkaline Phosphatase: 77 U/L (ref 39–117)
BUN: 19 mg/dL (ref 6–23)
CO2: 30 meq/L (ref 19–32)
Calcium: 9.4 mg/dL (ref 8.4–10.5)
Chloride: 103 meq/L (ref 96–112)
Creatinine, Ser: 0.61 mg/dL (ref 0.40–1.20)
GFR: 93.35 mL/min (ref >60.00)
Glucose, Bld: 81 mg/dL (ref 70–99)
Potassium: 3.9 meq/L (ref 3.5–5.1)
Sodium: 140 meq/L (ref 135–145)
Total Bilirubin: 0.5 mg/dL (ref 0.2–1.2)
Total Protein: 7.1 g/dL (ref 6.0–8.3)

## 2023-08-06 LAB — TSH: TSH: 2.27 u[IU]/mL (ref 0.35–5.50)

## 2023-08-06 LAB — LIPID PANEL
Cholesterol: 211 mg/dL — ABNORMAL HIGH (ref 0–200)
HDL: 70.1 mg/dL (ref >39.00)
LDL Cholesterol: 128 mg/dL — ABNORMAL HIGH (ref 0–99)
NonHDL: 141.13
Total CHOL/HDL Ratio: 3
Triglycerides: 68 mg/dL (ref 0.0–149.0)
VLDL: 13.6 mg/dL (ref 0.0–40.0)

## 2023-08-06 LAB — HEMOGLOBIN A1C: Hgb A1c MFr Bld: 5.3 % (ref 4.6–6.5)

## 2023-08-06 LAB — VITAMIN D 25 HYDROXY (VIT D DEFICIENCY, FRACTURES): VITD: 45.33 ng/mL (ref 30.00–100.00)

## 2023-08-06 MED ORDER — DULOXETINE HCL 20 MG PO CPEP
20.0000 mg | ORAL_CAPSULE | Freq: Every day | ORAL | 3 refills | Status: DC
Start: 2023-08-06 — End: 2024-08-11

## 2023-08-06 MED ORDER — TRAZODONE HCL 100 MG PO TABS
100.0000 mg | ORAL_TABLET | Freq: Every day | ORAL | 3 refills | Status: DC
Start: 2023-08-06 — End: 2024-08-11

## 2023-08-06 MED ORDER — LEVOTHYROXINE SODIUM 50 MCG PO TABS
50.0000 ug | ORAL_TABLET | Freq: Every day | ORAL | 3 refills | Status: DC
Start: 2023-08-06 — End: 2024-08-11

## 2023-08-06 NOTE — Assessment & Plan Note (Signed)
Chronic. Stable on Cymbalta 20 mg daily. Continue. Refills sent. Symptoms well managed at this time. Encouraged patient to contact if symptoms are changing or worsening.

## 2023-08-06 NOTE — Assessment & Plan Note (Signed)
Chronic. Stable on Levothyroxine 50 mcg daily. Continue. Refills sent. Will check TSH today.

## 2023-08-06 NOTE — Assessment & Plan Note (Signed)
Taking vitamin D and calcium supplement. Will check vitamin D level. Encouraged weight bearing exercises.

## 2023-08-06 NOTE — Assessment & Plan Note (Signed)
Physical exam complete. Lab work as outlined. Will contact patient with results. Pap- no longer indicated. Mammogram- UTD. Colonoscopy- UTD. Flu vaccine given today in office. Tetanus vaccine- UTD. Declined additional COVID vaccines. Shingles vaccine- series completed. Pneumonia vaccines- series completed. Declined HIV and Hep C screenings. Recommended follow ups with Dentist and Ophthalmology for annual exams. Encouraged to continue healthy diet and begin exercising. Return to care in one year, sooner PRN.

## 2023-08-06 NOTE — Assessment & Plan Note (Signed)
Chronic. Stable on Trazodone 100 mg daily at bedtime. Continue. Refills sent.

## 2023-08-06 NOTE — Assessment & Plan Note (Signed)
Chronic. Stable with diet control. Continue. Encouraged healthy diet and exercise. Will check fasting lipids today.

## 2023-08-25 DIAGNOSIS — H26492 Other secondary cataract, left eye: Secondary | ICD-10-CM | POA: Diagnosis not present

## 2024-02-29 ENCOUNTER — Encounter: Payer: Self-pay | Admitting: Family Medicine

## 2024-02-29 ENCOUNTER — Ambulatory Visit (INDEPENDENT_AMBULATORY_CARE_PROVIDER_SITE_OTHER): Admitting: Family Medicine

## 2024-02-29 ENCOUNTER — Other Ambulatory Visit: Payer: Self-pay

## 2024-02-29 VITALS — BP 118/62 | HR 66 | Temp 98.4°F | Resp 20 | Ht 62.0 in | Wt 146.4 lb

## 2024-02-29 DIAGNOSIS — H6121 Impacted cerumen, right ear: Secondary | ICD-10-CM

## 2024-02-29 DIAGNOSIS — H6693 Otitis media, unspecified, bilateral: Secondary | ICD-10-CM

## 2024-02-29 MED ORDER — AMOXICILLIN-POT CLAVULANATE 875-125 MG PO TABS
1.0000 | ORAL_TABLET | Freq: Two times a day (BID) | ORAL | 0 refills | Status: AC
Start: 2024-02-29 — End: 2024-03-05

## 2024-02-29 MED ORDER — DEBROX 6.5 % OT SOLN
5.0000 [drp] | Freq: Two times a day (BID) | OTIC | 0 refills | Status: DC
Start: 2024-02-29 — End: 2024-07-10

## 2024-02-29 NOTE — Patient Instructions (Addendum)
 It was a pleasure meeting you today. Thank you for allowing me to take part in your health care.  Our goals for today as we discussed include:  Start Augmentin 1 tablet 2 times a day for 5 days Take Probiotics daily and continue for 14 days after completion of antibiotics  Use Debrox 5 drops to right ear two times a day for 5 days Schedule nurse appointment next week for ear flushing    This is a list of the screening recommended for you and due dates:  Health Maintenance  Topic Date Due   Hepatitis C Screening  Never done   DEXA scan (bone density measurement)  Never done   COVID-19 Vaccine (5 - 2024-25 season) 07/04/2023   DTaP/Tdap/Td vaccine (2 - Td or Tdap) 12/09/2023   Flu Shot  06/02/2024   Medicare Annual Wellness Visit  07/06/2024   Mammogram  02/03/2025   Colon Cancer Screening  04/04/2028   Pneumonia Vaccine  Completed   Zoster (Shingles) Vaccine  Completed   HPV Vaccine  Aged Out   Meningitis B Vaccine  Aged Out       If you have any questions or concerns, please do not hesitate to call the office at [phone].  I look forward to our next visit and until then take care and stay safe.  Regards,   Valli Gaw, MD   [facility]

## 2024-02-29 NOTE — Progress Notes (Signed)
 SUBJECTIVE:   Chief Complaint  Patient presents with   Ear Fullness    X 3 day coming and going Both ears but mostly the right ear now   HPI Presents for acute visit  Discussed the use of AI scribe software for clinical note transcription with the patient, who gave verbal consent to proceed.  History of Present Illness NYAZIA LENIUS "Carolyn Shea" is a 68 year old female who presents with ear congestion and hearing changes.  She experienced sudden onset of ear congestion on Sunday, which improved by the next morning. However, after working all day on Monday, she felt fatigued and the congestion returned, accompanied by a 'weird' sound in her ears, described as air puffing. By Tuesday morning, the congestion was less severe, but she still felt some blockage, particularly in one ear. Initially, the congestion was present in both ears and was associated with dizziness. No prior episodes of similar symptoms have occurred. There is no ear drainage, but she experienced a decrease in hearing, particularly on Monday, which has since improved slightly. No ear pain, fever, cough, sore throat, recent colds, sinus congestion, or runny nose.  She mentions recent headaches that started about a week ago, initially severe and now lingering. She traveled by car to [place]  and back last week, a trip lasting about seven to eight hours each way. She stayed in a hotel but did not use saunas or swim. She uses earbuds that sit outside the ear and occasionally uses Q-tips for cleaning.  She has a history of eczema, which she mentions in relation to her ear symptoms. No history of diabetes. She is not currently on any medications for her ear symptoms.    PERTINENT PMH / PSH: As above  OBJECTIVE:  BP 118/62   Pulse 66   Temp 98.4 F (36.9 C)   Resp 20   Ht 5\' 2"  (1.575 m)   Wt 146 lb 6 oz (66.4 kg)   SpO2 99%   BMI 26.77 kg/m    Physical Exam Vitals reviewed.  Constitutional:      General: She is not  in acute distress.    Appearance: Normal appearance. She is not ill-appearing, toxic-appearing or diaphoretic.  HENT:     Right Ear: There is impacted cerumen.     Left Ear: A middle ear effusion is present.  Eyes:     General:        Right eye: No discharge.        Left eye: No discharge.     Conjunctiva/sclera: Conjunctivae normal.  Cardiovascular:     Rate and Rhythm: Normal rate.  Pulmonary:     Effort: Pulmonary effort is normal.  Musculoskeletal:        General: Normal range of motion.  Skin:    General: Skin is warm and dry.  Neurological:     General: No focal deficit present.     Mental Status: She is alert and oriented to person, place, and time. Mental status is at baseline.  Psychiatric:        Mood and Affect: Mood normal.        Behavior: Behavior normal.        Thought Content: Thought content normal.        Judgment: Judgment normal.           02/29/2024   10:58 AM 08/06/2023    8:14 AM 07/07/2023   11:29 AM 01/20/2023    1:23 PM 01/20/2023  1:20 PM  Depression screen PHQ 2/9  Decreased Interest 0 0 0 0 0  Down, Depressed, Hopeless 0 0 0 0 0  PHQ - 2 Score 0 0 0 0 0  Altered sleeping 0 0 0 0   Tired, decreased energy 0 0 0 0   Change in appetite 0 0 0 0   Feeling bad or failure about yourself  0 0 0 0   Trouble concentrating 0 0 0 0   Moving slowly or fidgety/restless 0 0 0 0   Suicidal thoughts 0 0 0 0   PHQ-9 Score 0 0 0 0   Difficult doing work/chores Not difficult at all Not difficult at all Not difficult at all Not difficult at all       02/29/2024   10:58 AM 08/06/2023    8:14 AM  GAD 7 : Generalized Anxiety Score  Nervous, Anxious, on Edge 0 0  Control/stop worrying 0 0  Worry too much - different things 0 0  Trouble relaxing 0 0  Restless 0 0  Easily annoyed or irritable 0 0  Afraid - awful might happen 0 0  Total GAD 7 Score 0 0  Anxiety Difficulty Not difficult at all Not difficult at all    ASSESSMENT/PLAN:  Otitis of both  ears Assessment & Plan: Bilateral ear fullness and decreased hearing, more pronounced on the right side. Significant earwax impaction in the right ear and fluid in the left ear. Possible otitis media considered. Headaches and dizziness may be related. - Prescribed Augmentin  BID x 5 days -Start Probiotics daily - Advised to contact via MyChart if symptoms worsen. - Consider ENT referral if no improvement by Friday.   Orders: -     Amoxicillin -Pot Clavulanate; Take 1 tablet by mouth 2 (two) times daily for 5 days.  Dispense: 10 tablet; Refill: 0  Impacted cerumen of right ear Assessment & Plan: - Prescribed Debrox ear drops, 5 drops in each ear twice daily.  - Scheduled follow-up for ear irrigation after 5 days of Debrox   Orders: -     Debrox; Place 5 drops into both ears 2 (two) times daily.  Dispense: 15 mL; Refill: 0     PDMP reviewed  Return in about 1 week (around 03/07/2024) for RN clinic.  Valli Gaw, MD

## 2024-03-05 ENCOUNTER — Encounter: Payer: Self-pay | Admitting: Family Medicine

## 2024-03-05 DIAGNOSIS — H6693 Otitis media, unspecified, bilateral: Secondary | ICD-10-CM | POA: Insufficient documentation

## 2024-03-05 DIAGNOSIS — H6121 Impacted cerumen, right ear: Secondary | ICD-10-CM | POA: Insufficient documentation

## 2024-03-05 NOTE — Assessment & Plan Note (Addendum)
 Bilateral ear fullness and decreased hearing, more pronounced on the right side. Significant earwax impaction in the right ear and fluid in the left ear. Possible otitis media considered. Headaches and dizziness may be related. - Prescribed Augmentin  BID x 5 days -Start Probiotics daily - Advised to contact via MyChart if symptoms worsen. - Consider ENT referral if no improvement by Friday.

## 2024-03-05 NOTE — Assessment & Plan Note (Signed)
-   Prescribed Debrox ear drops, 5 drops in each ear twice daily.  - Scheduled follow-up for ear irrigation after 5 days of Debrox

## 2024-03-07 ENCOUNTER — Ambulatory Visit

## 2024-03-07 NOTE — Progress Notes (Signed)
 Cerumen Impaction Patient presents with Wax Impaction in the right ear for the past 10 week. There  a prior history of cerumen impaction. The patient has been using ear drops to loosen wax immediately prior to this visit. The patient denies ear pain.  Before I began I placed a few drops of peroxide in ear and had patient lie down on opposite side. This step was done on the opposite ear as well.    Before I began flushing I checked in each ear so that I can track my progress. I had pt feel the water to see if it was too hot or too cold. Pt verbalized it was okay so I proceeded; periodically asking if patient was okay.    Process was completed & pt denied any pain or dizziness.  Ear were visualized one last time using otoscope prior to provider taking over.  Carolyn Shea came in and checked both ears and ears are clear pt was advised by Carolyn Shea to use Flonase if she still feels like they are clogged

## 2024-06-21 ENCOUNTER — Other Ambulatory Visit: Payer: Self-pay | Admitting: Nurse Practitioner

## 2024-06-21 NOTE — Telephone Encounter (Signed)
 Copied from CRM #[id number]. Topic: Clinical - Medication Refill >> Jun 21, 2024 10:33 AM Berneda FALCON wrote: Medication: estradiol (ESTRACE) 0.1 MG/GM vaginal cream  Please note, this was first prescribed by previous provider, but she has been seeing Gretel for over a year.  Has the patient contacted their pharmacy? No, this was prescribed by previous PCP (Agent: If no, request that the patient contact the pharmacy for the refill. If patient does not wish to contact the pharmacy document the reason why and proceed with request.) (Agent: If yes, when and what did the pharmacy advise?)  This is the patient's preferred pharmacy:  [facility] [place], [place] - [address] [phone] [address] [place] [address]   Is this the correct pharmacy for this prescription? Yes If no, delete pharmacy and type the correct one.   Has the prescription been filled recently? No  Is the patient out of the medication? No  Has the patient been seen for an appointment in the last year OR does the patient have an upcoming appointment? Yes  Can we respond through MyChart? Yes  Agent: Please be advised that Rx refills may take up to 3 business days. We ask that you follow-up with your pharmacy.

## 2024-07-06 ENCOUNTER — Encounter (HOSPITAL_BASED_OUTPATIENT_CLINIC_OR_DEPARTMENT_OTHER): Payer: Self-pay

## 2024-07-10 ENCOUNTER — Ambulatory Visit (INDEPENDENT_AMBULATORY_CARE_PROVIDER_SITE_OTHER): Payer: Medicare Other | Admitting: *Deleted

## 2024-07-10 VITALS — Ht 62.0 in | Wt 147.0 lb

## 2024-07-10 DIAGNOSIS — Z0000 Encounter for general adult medical examination without abnormal findings: Secondary | ICD-10-CM

## 2024-07-10 NOTE — Progress Notes (Signed)
 Subjective:   Carolyn Shea is a 68 y.o. who presents for a Medicare Wellness preventive visit.  As a reminder, Annual Wellness Visits don't include a physical exam, and some assessments may be limited, especially if this visit is performed virtually. We may recommend an in-person follow-up visit with your provider if needed.  Visit Complete: Virtual I connected with  Carolyn Shea on 07/10/24 by a audio enabled telemedicine application and verified that I am speaking with the correct person using two identifiers.  Patient Location: Home  Provider Location: Home Office  I discussed the limitations of evaluation and management by telemedicine. The patient expressed understanding and agreed to proceed.  Vital Signs: Because this visit was a virtual/telehealth visit, some criteria may be missing or patient reported. Any vitals not documented were not able to be obtained and vitals that have been documented are patient reported.  VideoDeclined- This patient declined Librarian, academic. Therefore the visit was completed with audio only.  Persons Participating in Visit: Patient.  AWV Questionnaire: Yes: Patient Medicare AWV questionnaire was completed by the patient on 07/06/24; I have confirmed that all information answered by patient is correct and no changes since this date.  Cardiac Risk Factors include: advanced age (>28men, >4 women);dyslipidemia     Objective:    Today's Vitals   07/10/24 1134  Weight: 147 lb (66.7 kg)  Height: 5' 2 (1.575 m)   Body mass index is 26.89 kg/m.     07/10/2024   11:42 AM 07/07/2023   11:33 AM 04/05/2023    9:19 AM  Advanced Directives  Does Patient Have a Medical Advance Directive? Yes Yes No;Yes  Type of Estate agent of [place];Living will Healthcare Power of [place];Living will Living will  Copy of Healthcare Power of Attorney in Chart? No - copy requested No - copy requested      Current Medications (verified) Outpatient Encounter Medications as of 07/10/2024  Medication Sig   Calcium-Vitamin D -Vitamin K (VIACTIV CALCIUM PLUS D) 650-12.5-40 MG-MCG-MCG CHEW Chew by mouth. Chew two daily   Carboxymethylcellulose Sod PF (REFRESH PLUS) 0.5 % SOLN Place 2 drops into both eyes daily.   Cholecalciferol (VITAMIN D ) 50 MCG (2000 UT) CAPS Take by mouth.   Cranberry 4200 MG CAPS Take by mouth. Take two by mouth daily   cyanocobalamin (VITAMIN B12) 500 MCG tablet Take 1,000 mcg by mouth.   DULoxetine  (CYMBALTA ) 20 MG capsule Take 1 capsule (20 mg total) by mouth daily.   estradiol (ESTRACE) 0.1 MG/GM vaginal cream Place 1 Applicatorful vaginally once a week.   Flaxseed, Linseed, (FLAX SEED OIL) 1000 MG CAPS Take by mouth.   glucosamine-chondroitin 500-400 MG tablet Take by mouth.   levothyroxine  (SYNTHROID ) 50 MCG tablet Take 1 tablet (50 mcg total) by mouth daily before breakfast.   Multiple Vitamin (MULTIVITAMIN) capsule Take 1 capsule by mouth daily.   Phentermine HCl 8 MG TABS Take 1 tablet by mouth in the morning, at noon, and at bedtime.   traZODone  (DESYREL ) 100 MG tablet Take 1 tablet (100 mg total) by mouth at bedtime.   [DISCONTINUED] carbamide peroxide (DEBROX) 6.5 % OTIC solution Place 5 drops into both ears 2 (two) times daily.   No facility-administered encounter medications on file as of 07/10/2024.    Allergies (verified) Ciprofloxacin   History: Past Medical History:  Diagnosis Date   Colon polyps    Depression    Hypothyroidism    Urine incontinence    UTI (  urinary tract infection)    Past Surgical History:  Procedure Laterality Date   BREAST BIOPSY Left    CHOLECYSTECTOMY     COLONOSCOPY WITH PROPOFOL  N/A 02/22/2023   Procedure: COLONOSCOPY WITH PROPOFOL ;  Surgeon: Therisa Bi, MD;  Location: [facility] ENDOSCOPY;  Service: Gastroenterology;  Laterality: N/A;   COLONOSCOPY WITH PROPOFOL  N/A 04/05/2023   Procedure: COLONOSCOPY WITH PROPOFOL ;  Surgeon:  Therisa Bi, MD;  Location[facility] ENDOSCOPY;  Service: Gastroenterology;  Laterality: N/A;   MOUTH SURGERY     Family History  Problem Relation Age of Onset   COPD Mother    Cancer Mother    Diabetes Sister    Depression Sister    COPD Sister    Cancer Sister    Mental illness Sister    Cancer Sister    COPD Sister    Depression Sister    Diabetes Sister    Asthma Son    Heart disease Maternal Grandmother    Diabetes Maternal Grandmother    Heart disease Maternal Grandfather    COPD Maternal Grandfather    Cancer Maternal Grandfather    Social History   Socioeconomic History   Marital status: Unknown    Spouse name: Not on file   Number of children: Not on file   Years of education: Not on file   Highest education level: Some college, no degree  Occupational History   Not on file  Tobacco Use   Smoking status: Former    Types: Cigarettes   Smokeless tobacco: Never  Vaping Use   Vaping status: Never Used  Substance and Sexual Activity   Alcohol use: Not Currently   Drug use: Never   Sexual activity: Not Currently  Other Topics Concern   Not on file  Social History Narrative   divorced   Social Drivers of Health   Financial Resource Strain: Low Risk  (07/06/2024)   Overall Financial Resource Strain (CARDIA)    Difficulty of Paying Living Expenses: Not hard at all  Food Insecurity: No Food Insecurity (07/06/2024)   Hunger Vital Sign    Worried About Running Out of Food in the Last Year: Never true    Ran Out of Food in the Last Year: Never true  Transportation Needs: No Transportation Needs (07/06/2024)   PRAPARE - Administrator, Civil Service (Medical): No    Lack of Transportation (Non-Medical): No  Physical Activity: Insufficiently Active (07/06/2024)   Exercise Vital Sign    Days of Exercise per Week: 7 days    Minutes of Exercise per Session: 20 min  Stress: No Stress Concern Present (07/06/2024)   [employer] of Occupational Health -  Occupational Stress Questionnaire    Feeling of Stress: Not at all  Social Connections: Moderately Integrated (07/06/2024)   Social Connection and Isolation Panel    Frequency of Communication with Friends and Family: Twice a week    Frequency of Social Gatherings with Friends and Family: Once a week    Attends Religious Services: More than 4 times per year    Active Member of [employer] or Organizations: Yes    Attends Engineer, structural: More than 4 times per year    Marital Status: Divorced    Tobacco Counseling Counseling given: Not Answered    Clinical Intake:  Pre-visit preparation completed: Yes  Pain : No/denies pain     BMI - recorded: 26.89 Nutritional Status: BMI 25 -29 Overweight Nutritional Risks: None Diabetes: No  Lab Results  Component  Value Date   HGBA1C 5.3 08/06/2023     How often do you need to have someone help you when you read instructions, pamphlets, or other written materials from your doctor or pharmacy?: 1 - Never  Interpreter Needed?: No  Information entered by :: R. Quandra Fedorchak LPN   Activities of Daily Living     07/06/2024    5:43 AM  In your present state of health, do you have any difficulty performing the following activities:  Hearing? 0  Vision? 0  Difficulty concentrating or making decisions? 0  Walking or climbing stairs? 0  Dressing or bathing? 0  Doing errands, shopping? 0  Preparing Food and eating ? N  Using the Toilet? N  In the past six months, have you accidently leaked urine? Y  Do you have problems with loss of bowel control? Y  Managing your Medications? N  Managing your Finances? N  Housekeeping or managing your Housekeeping? N    Patient Care Team: Gretel App, NP as PCP - General (Nurse Practitioner) Therisa Bi, MD as Consulting Physician (Gastroenterology)  I have updated your Care Teams any recent Medical Services you may have received from other providers in the past year.     Assessment:   This  is a routine wellness examination for Carolyn Shea.  Hearing/Vision screen Hearing Screening - Comments:: No issues Vision Screening - Comments:: glasses   Goals Addressed             This Visit's Progress    Patient Stated       Wants to lose a few more pounds       Depression Screen     07/10/2024   11:39 AM 02/29/2024   10:58 AM 08/06/2023    8:14 AM 07/07/2023   11:29 AM 01/20/2023    1:23 PM 01/20/2023    1:20 PM  PHQ 2/9 Scores  PHQ - 2 Score 0 0 0 0 0 0  PHQ- 9 Score 0 0 0 0 0     Fall Risk     07/06/2024    5:43 AM 02/29/2024   10:57 AM 08/06/2023    8:14 AM 07/05/2023    5:38 AM 01/20/2023    1:19 PM  Fall Risk   Falls in the past year? 0 0 0 0 0  Number falls in past yr: 0 0 0 0 0  Injury with Fall? 0 0 0 0 0  Risk for fall due to : No Fall Risks No Fall Risks No Fall Risks No Fall Risks No Fall Risks  Follow up Falls evaluation completed;Falls prevention discussed Falls evaluation completed;Education provided  Falls prevention discussed;Falls evaluation completed Falls evaluation completed    MEDICARE RISK AT HOME:  Medicare Risk at Home Any stairs in or around the home?: (Patient-Rptd) No If so, are there any without handrails?: No Home free of loose throw rugs in walkways, pet beds, electrical cords, etc?: (Patient-Rptd) Yes Adequate lighting in your home to reduce risk of falls?: (Patient-Rptd) Yes Life alert?: (Patient-Rptd) No Use of a cane, walker or w/c?: (Patient-Rptd) No Grab bars in the bathroom?: (Patient-Rptd) No Shower chair or bench in shower?: (Patient-Rptd) No Elevated toilet seat or a handicapped toilet?: (Patient-Rptd) No  TIMED UP AND GO:  Was the test performed?  No  Cognitive Function: 6CIT completed        07/10/2024   11:43 AM 07/07/2023   11:33 AM  6CIT Screen  What Year? 0 points 0 points  What month?  0 points 0 points  What time? 0 points 0 points  Count back from 20 0 points 0 points  Months in reverse 0 points 0 points  Repeat  phrase 0 points 0 points  Total Score 0 points 0 points    Immunizations Immunization History  Administered Date(s) Administered   Fluad Quad(high Dose 65+) 08/05/2022   Fluad Trivalent(High Dose 65+) 08/06/2023   INFLUENZA, HIGH DOSE SEASONAL PF 08/05/2022   Influenza, Quadrivalent, Recombinant, Inj, Pf 08/05/2018   Influenza,inj,Quad PF,6+ Mos 08/04/2017, 06/26/2019, 07/05/2020, 07/11/2021   Influenza-Unspecified 08/08/2016, 08/04/2017, 08/05/2018   PFIZER(Purple Top)SARS-COV-2 Vaccination 01/26/2020, 02/15/2020, 09/28/2020   PNEUMOCOCCAL CONJUGATE-20 08/05/2022   Pfizer(Comirnaty)Fall Seasonal Vaccine 12 years and older 10/03/2022   Respiratory Syncytial Virus Vaccine,Recomb Aduvanted(Arexvy) 10/03/2022   Tdap 12/08/2013   Zoster Recombinant(Shingrix) 07/05/2020, 01/14/2021    Screening Tests Health Maintenance  Topic Date Due   Hepatitis C Screening  Never done   DEXA SCAN  Never done   DTaP/Tdap/Td (2 - Td or Tdap) 12/09/2023   Influenza Vaccine  06/02/2024   COVID-19 Vaccine (5 - 2025-26 season) 07/03/2024   Medicare Annual Wellness (AWV)  07/06/2024   MAMMOGRAM  02/03/2025   Colonoscopy  04/04/2028   Pneumococcal Vaccine: 50+ Years  Completed   Zoster Vaccines- Shingrix  Completed   HPV VACCINES  Aged Out   Meningococcal B Vaccine  Aged Out    Health Maintenance Items Addressed: Discussed the need to update Tetanus and fu vaccines. Patient declines covid vaccine. Patient stated that she has had a Dexa but not sure when. Patient is going to get the paperwork and bring to her next office visit to have her records updated. Patient is not sure about Hepatitis C screening but wants to check on that also.   Additional Screening:  Vision Screening: Recommended annual ophthalmology exams for early detection of glaucoma and other disorders of the eye. Is the patient up to date with their annual eye exam?  Yes  Who is the provider or what is the name of the office in which  the patient attends annual eye exams? [facility]  Dental Screening: Recommended annual dental exams for proper oral hygiene  Community Resource Referral / Chronic Care Management: CRR required this visit?  No   CCM required this visit?  No   Plan:    I have personally reviewed and noted the following in the patient's chart:   Medical and social history Use of alcohol, tobacco or illicit drugs  Current medications and supplements including opioid prescriptions. Patient is not currently taking opioid prescriptions. Functional ability and status Nutritional status Physical activity Advanced directives List of other physicians Hospitalizations, surgeries, and ER visits in previous 12 months Vitals Screenings to include cognitive, depression, and falls Referrals and appointments  In addition, I have reviewed and discussed with patient certain preventive protocols, quality metrics, and best practice recommendations. A written personalized care plan for preventive services as well as general preventive health recommendations were provided to patient.   Angeline Fredericks, LPN   [id number]   After Visit Summary: (MyChart) Due to this being a telephonic visit, the after visit summary with patients personalized plan was offered to patient via MyChart   Notes: Nothing significant to report at this time.

## 2024-07-10 NOTE — Patient Instructions (Signed)
 Carolyn Shea,  Thank you for taking the time for your Medicare Wellness Visit. I appreciate your continued commitment to your health goals. Please review the care plan we discussed, and feel free to reach out if I can assist you further.  Medicare recommends these wellness visits once per year to help you and your care team stay ahead of potential health issues. These visits are designed to focus on prevention, allowing your provider to concentrate on managing your acute and chronic conditions during your regular appointments.  Please note that Annual Wellness Visits do not include a physical exam. Some assessments may be limited, especially if the visit was conducted virtually. If needed, we may recommend a separate in-person follow-up with your provider.  Ongoing Care Seeing your primary care provider every 3 to 6 months helps us  monitor your health and provide consistent, personalized care. Remember to update your Tetanus and flu vaccines.   Referrals If a referral was made during today's visit and you haven't received any updates within two weeks, please contact the referred provider directly to check on the status.  Recommended Screenings:  Health Maintenance  Topic Date Due   Hepatitis C Screening  Never done   DEXA scan (bone density measurement)  Never done   DTaP/Tdap/Td vaccine (2 - Td or Tdap) 12/09/2023   Flu Shot  06/02/2024   COVID-19 Vaccine (5 - 2025-26 season) 07/03/2024   Mammogram  02/03/2025   Medicare Annual Wellness Visit  07/10/2025   Colon Cancer Screening  04/04/2028   Pneumococcal Vaccine for age over 57  Completed   Zoster (Shingles) Vaccine  Completed   HPV Vaccine  Aged Out   Meningitis B Vaccine  Aged Out       07/10/2024   11:42 AM  Advanced Directives  Does Patient Have a Medical Advance Directive? Yes  Type of Estate agent of [place];Living will  Copy of Healthcare Power of Attorney in Chart? No - copy requested   Advance  Care Planning is important because it: Ensures you receive medical care that aligns with your values, goals, and preferences. Provides guidance to your family and loved ones, reducing the emotional burden of decision-making during critical moments.  Vision: Annual vision screenings are recommended for early detection of glaucoma, cataracts, and diabetic retinopathy. These exams can also reveal signs of chronic conditions such as diabetes and high blood pressure.  Dental: Annual dental screenings help detect early signs of oral cancer, gum disease, and other conditions linked to overall health, including heart disease and diabetes.  Please see the attached documents for additional preventive care recommendations.

## 2024-08-11 ENCOUNTER — Ambulatory Visit: Payer: Medicare Other | Admitting: Nurse Practitioner

## 2024-08-11 ENCOUNTER — Encounter: Payer: Self-pay | Admitting: Nurse Practitioner

## 2024-08-11 VITALS — BP 120/70 | Temp 97.6°F | Ht 62.0 in | Wt 152.0 lb

## 2024-08-11 DIAGNOSIS — Z23 Encounter for immunization: Secondary | ICD-10-CM | POA: Diagnosis not present

## 2024-08-11 DIAGNOSIS — G4700 Insomnia, unspecified: Secondary | ICD-10-CM | POA: Diagnosis not present

## 2024-08-11 DIAGNOSIS — E782 Mixed hyperlipidemia: Secondary | ICD-10-CM | POA: Diagnosis not present

## 2024-08-11 DIAGNOSIS — Z1231 Encounter for screening mammogram for malignant neoplasm of breast: Secondary | ICD-10-CM

## 2024-08-11 DIAGNOSIS — Z0000 Encounter for general adult medical examination without abnormal findings: Secondary | ICD-10-CM

## 2024-08-11 DIAGNOSIS — F330 Major depressive disorder, recurrent, mild: Secondary | ICD-10-CM

## 2024-08-11 DIAGNOSIS — Z780 Asymptomatic menopausal state: Secondary | ICD-10-CM

## 2024-08-11 DIAGNOSIS — E039 Hypothyroidism, unspecified: Secondary | ICD-10-CM | POA: Diagnosis not present

## 2024-08-11 LAB — CBC WITH DIFFERENTIAL/PLATELET
Basophils Absolute: 0.1 K/uL (ref 0.0–0.1)
Basophils Relative: 1.4 % (ref 0.0–3.0)
Eosinophils Absolute: 0.1 K/uL (ref 0.0–0.7)
Eosinophils Relative: 1.6 % (ref 0.0–5.0)
HCT: 40.2 % (ref 36.0–46.0)
Hemoglobin: 13.4 g/dL (ref 12.0–15.0)
Lymphocytes Relative: 40.3 % (ref 12.0–46.0)
Lymphs Abs: 1.8 K/uL (ref 0.7–4.0)
MCHC: 33.4 g/dL (ref 30.0–36.0)
MCV: 89.1 fl (ref 78.0–100.0)
Monocytes Absolute: 0.4 K/uL (ref 0.1–1.0)
Monocytes Relative: 9.6 % (ref 3.0–12.0)
Neutro Abs: 2.1 K/uL (ref 1.4–7.7)
Neutrophils Relative %: 47.1 % (ref 43.0–77.0)
Platelets: 320 K/uL (ref 150.0–400.0)
RBC: 4.51 Mil/uL (ref 3.87–5.11)
RDW: 13 % (ref 11.5–15.5)
WBC: 4.5 K/uL (ref 4.0–10.5)

## 2024-08-11 LAB — COMPREHENSIVE METABOLIC PANEL WITH GFR
ALT: 14 U/L (ref 0–35)
AST: 15 U/L (ref 0–37)
Albumin: 4.6 g/dL (ref 3.5–5.2)
Alkaline Phosphatase: 82 U/L (ref 39–117)
BUN: 20 mg/dL (ref 6–23)
CO2: 31 meq/L (ref 19–32)
Calcium: 9.8 mg/dL (ref 8.4–10.5)
Chloride: 101 meq/L (ref 96–112)
Creatinine, Ser: 0.54 mg/dL (ref 0.40–1.20)
GFR: 95.45 mL/min (ref >60.00)
Glucose, Bld: 84 mg/dL (ref 70–99)
Potassium: 4.7 meq/L (ref 3.5–5.1)
Sodium: 139 meq/L (ref 135–145)
Total Bilirubin: 0.5 mg/dL (ref 0.2–1.2)
Total Protein: 7.1 g/dL (ref 6.0–8.3)

## 2024-08-11 LAB — LIPID PANEL
Cholesterol: 234 mg/dL — ABNORMAL HIGH (ref 0–200)
HDL: 75.8 mg/dL (ref >39.00)
LDL Cholesterol: 143 mg/dL — ABNORMAL HIGH (ref 0–99)
NonHDL: 158.25
Total CHOL/HDL Ratio: 3
Triglycerides: 77 mg/dL (ref 0.0–149.0)
VLDL: 15.4 mg/dL (ref 0.0–40.0)

## 2024-08-11 LAB — TSH: TSH: 1.93 u[IU]/mL (ref 0.35–5.50)

## 2024-08-11 LAB — VITAMIN D 25 HYDROXY (VIT D DEFICIENCY, FRACTURES): VITD: 34.53 ng/mL (ref 30.00–100.00)

## 2024-08-11 MED ORDER — DULOXETINE HCL 20 MG PO CPEP
20.0000 mg | ORAL_CAPSULE | Freq: Every day | ORAL | 3 refills | Status: AC
Start: 2024-08-11 — End: ?

## 2024-08-11 MED ORDER — LEVOTHYROXINE SODIUM 50 MCG PO TABS
50.0000 ug | ORAL_TABLET | Freq: Every day | ORAL | 3 refills | Status: AC
Start: 2024-08-11 — End: ?

## 2024-08-11 MED ORDER — TRAZODONE HCL 100 MG PO TABS
100.0000 mg | ORAL_TABLET | Freq: Every day | ORAL | 3 refills | Status: AC
Start: 2024-08-11 — End: ?

## 2024-08-11 MED ORDER — ESTRADIOL 0.01 % VA CREA: 1.0000 | TOPICAL_CREAM | VAGINAL | 12 refills | Status: AC

## 2024-08-11 NOTE — Progress Notes (Signed)
 Leron Glance, NP-C Phone: [phone]  Carolyn Shea is a 68 y.o. female who presents today for annual exam.   Discussed the use of AI scribe software for clinical note transcription with the patient, who gave verbal consent to proceed.  History of Present Illness   Carolyn Shea is a 68 year old female who presents for an annual physical exam.  She reports no new problems or concerns since her last visit a year ago. She is up to date with her colonoscopy, last done in 2024, and is due for a mammogram. She has completed her shingles and pneumonia vaccinations and has received four COVID vaccines.  Her past medical history includes hypothyroidism, for which she takes levothyroxine  every morning. She experiences no heart palpitations or temperature problems. 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 Cymbalta  has significantly reduced the pain, although she still experiences tingling. Previous treatments with Lyrica and gabapentin were ineffective and had adverse effects.  She has a history of constipation since her last pregnancy, which she manages on her own. No abdominal pain, burning during urination, headaches, dizziness, or trouble swallowing. No skin changes or rashes.  She takes a B12 supplement, vitamin D , and calcium. She is due for a DEXA scan, which she last had three years ago. She uses estradiol cream once a week to prevent UTIs, which she has found effective. She notes that the dosage on her list is incorrect and clarifies that she uses a small amount weekly.  In terms of social history, she does not smoke, drink alcohol, or use drugs. She maintains a well-balanced diet, although she admits to not drinking enough water. She plans to increase her physical activity after retiring at the end of next week. She continues to see a dentist and an eye doctor regularly.      Social  History   Tobacco Use  Smoking Status Former   Current packs/day: 0.00   Types: Cigarettes  Smokeless Tobacco Never    Current Outpatient Medications on File Prior to Visit  Medication Sig Dispense Refill   Calcium-Vitamin D -Vitamin K (VIACTIV CALCIUM PLUS D) 650-12.5-40 MG-MCG-MCG CHEW Chew by mouth. Chew two daily     Carboxymethylcellulose Sod PF (REFRESH PLUS) 0.5 % SOLN Place 2 drops into both eyes daily.     Cholecalciferol (VITAMIN D ) 50 MCG (2000 UT) CAPS Take by mouth.     Cranberry 4200 MG CAPS Take by mouth. Take two by mouth daily     cyanocobalamin (VITAMIN B12) 500 MCG tablet Take 1,000 mcg by mouth.     Flaxseed, Linseed, (FLAX SEED OIL) 1000 MG CAPS Take by mouth.     glucosamine-chondroitin 500-400 MG tablet Take by mouth.     Multiple Vitamin (MULTIVITAMIN) capsule Take 1 capsule by mouth daily.     Phentermine HCl 8 MG TABS Take 1 tablet by mouth in the morning, at noon, and at bedtime.     No current facility-administered medications on file prior to visit.     ROS see history of present illness  Objective  Physical Exam Vitals:   08/11/24 0829  BP: 120/70  Temp: 97.6 F (36.4 C)    BP Readings from Last 3 Encounters:  08/11/24 120/70  02/29/24 118/62  08/06/23 110/80   Wt Readings from Last 3 Encounters:  08/11/24 152 lb (68.9 kg)  07/10/24 147 lb (66.7 kg)  02/29/24 146 lb 6 oz (  66.4 kg)    Physical Exam Constitutional:      General: She is not in acute distress.    Appearance: Normal appearance.  HENT:     Head: Normocephalic.     Right Ear: Tympanic membrane normal.     Left Ear: Tympanic membrane normal.     Nose: Nose normal.     Mouth/Throat:     Mouth: Mucous membranes are moist.     Pharynx: Oropharynx is clear.  Eyes:     Conjunctiva/sclera: Conjunctivae normal.     Pupils: Pupils are equal, round, and reactive to light.  Neck:     Thyroid : No thyromegaly.  Cardiovascular:     Rate and Rhythm: Normal rate and regular  rhythm.     Heart sounds: Normal heart sounds.  Pulmonary:     Effort: Pulmonary effort is normal.     Breath sounds: Normal breath sounds.  Abdominal:     General: Abdomen is flat. Bowel sounds are normal.     Palpations: Abdomen is soft. There is no mass.     Tenderness: There is no abdominal tenderness.  Musculoskeletal:        General: Normal range of motion.  Lymphadenopathy:     Cervical: No cervical adenopathy.  Skin:    General: Skin is warm and dry.     Findings: No rash.  Neurological:     General: No focal deficit present.     Mental Status: She is alert.  Psychiatric:        Mood and Affect: Mood normal.        Behavior: Behavior normal.      Assessment/Plan: Please see individual problem list.  Preventative health care Assessment & Plan: Physical exam complete. We will check lab work as outlined. Pap smear is no longer indicated due to age. Mammogram is due. Colonoscopy is up to date. Flu vaccine administered today. Tetanus vaccine is due, she will get this at her local pharmacy or health department. Shingles and pneumonia vaccine series completed. Declines additional COVID vaccines. Continue regular dental and eye exams. Mammogram ordered, encouraged to schedule. She plans to increase exercise post-retirement and has an adequate diet, though her water intake could improve. Encourage healthy diet and regular exercise. Return to care in one year, sooner as needed.   Orders: -     CBC with Differential/Platelet  Hypothyroidism, unspecified type Assessment & Plan: Her hypothyroidism is well-controlled with levothyroxine , and she reports no symptoms. Continue levothyroxine  daily and check TSH.   Orders: -     Levothyroxine  Sodium; Take 1 tablet (50 mcg total) by mouth daily before breakfast.  Dispense: 100 tablet; Refill: 3 -     TSH  Insomnia, unspecified type Assessment & Plan: Her chronic insomnia is managed with trazodone , and she reports good sleep quality.  Continue trazodone  nightly.  Orders: -     traZODone  HCl; Take 1 tablet (100 mg total) by mouth at bedtime.  Dispense: 100 tablet; Refill: 3  Mixed hyperlipidemia Assessment & Plan: Managed with diet control. Continue. Encourage healthy diet and regular exercise. Check lipid panel.   Orders: -     Comprehensive metabolic panel with GFR -     Lipid panel  Mild episode of recurrent major depressive disorder Assessment & Plan: Her depression and neuropathy is managed with Cymbalta , resulting in reduced pain and tingling. Mood stable. Continue Cymbalta .  Orders: -     DULoxetine  HCl; Take 1 capsule (20 mg total) by mouth daily.  Dispense: 100 capsule; Refill: 3  Postmenopausal estrogen deficiency Assessment & Plan: Managed with vaginal estradiol, which is effective and well-tolerated. Prescribe estradiol 0.01 mg/g for weekly use via mail order pharmacy. Check vitamin D .   Orders: -     VITAMIN D  25 Hydroxy (Vit-D Deficiency, Fractures) -     Estradiol; Place 1 Applicatorful vaginally once a week.  Dispense: 42.5 g; Refill: 12 -     DG Bone Density; Future  Screening mammogram for breast cancer -     3D Screening Mammogram, Left and Right; Future  Need for influenza vaccination -     Flu vaccine HIGH DOSE PF(Fluzone Trivalent)     Return in about 1 year (around 08/11/2025) for Annual Exam, sooner as needed.   Leron Glance, NP-C [facility]

## 2024-08-16 ENCOUNTER — Ambulatory Visit: Payer: Self-pay | Admitting: Nurse Practitioner

## 2024-08-17 ENCOUNTER — Other Ambulatory Visit: Payer: Self-pay | Admitting: Nurse Practitioner

## 2024-08-17 ENCOUNTER — Encounter: Payer: Self-pay | Admitting: Nurse Practitioner

## 2024-08-17 DIAGNOSIS — Z780 Asymptomatic menopausal state: Secondary | ICD-10-CM | POA: Insufficient documentation

## 2024-08-17 DIAGNOSIS — E782 Mixed hyperlipidemia: Secondary | ICD-10-CM

## 2024-08-17 MED ORDER — ROSUVASTATIN CALCIUM 10 MG PO TABS: 10.0000 mg | ORAL_TABLET | Freq: Every day | ORAL | 3 refills | Status: AC

## 2024-08-17 NOTE — Assessment & Plan Note (Signed)
 Managed with vaginal estradiol, which is effective and well-tolerated. Prescribe estradiol 0.01 mg/g for weekly use via mail order pharmacy. Check vitamin D .

## 2024-08-17 NOTE — Assessment & Plan Note (Addendum)
 Her depression and neuropathy is managed with Cymbalta , resulting in reduced pain and tingling. Mood stable. Continue Cymbalta .

## 2024-08-17 NOTE — Assessment & Plan Note (Signed)
 Her hypothyroidism is well-controlled with levothyroxine , and she reports no symptoms. Continue levothyroxine  daily and check TSH.

## 2024-08-17 NOTE — Assessment & Plan Note (Signed)
 Managed with diet control. Continue. Encourage healthy diet and regular exercise. Check lipid panel.

## 2024-08-17 NOTE — Assessment & Plan Note (Signed)
 Physical exam complete. We will check lab work as outlined. Pap smear is no longer indicated due to age. Mammogram is due. Colonoscopy is up to date. Flu vaccine administered today. Tetanus vaccine is due, she will get this at her local pharmacy or health department. Shingles and pneumonia vaccine series completed. Declines additional COVID vaccines. Continue regular dental and eye exams. Mammogram ordered, encouraged to schedule. She plans to increase exercise post-retirement and has an adequate diet, though her water intake could improve. Encourage healthy diet and regular exercise. Return to care in one year, sooner as needed.

## 2024-08-17 NOTE — Assessment & Plan Note (Signed)
 Her chronic insomnia is managed with trazodone , and she reports good sleep quality. Continue trazodone  nightly.

## 2024-10-02 ENCOUNTER — Other Ambulatory Visit

## 2024-10-02 DIAGNOSIS — E782 Mixed hyperlipidemia: Secondary | ICD-10-CM

## 2024-10-02 LAB — LIPID PANEL
Cholesterol: 150 mg/dL (ref 0–200)
HDL: 71.6 mg/dL (ref >39.00)
LDL Cholesterol: 66 mg/dL (ref 0–99)
NonHDL: 78.48
Total CHOL/HDL Ratio: 2
Triglycerides: 62 mg/dL (ref 0.0–149.0)
VLDL: 12.4 mg/dL (ref 0.0–40.0)

## 2024-10-02 LAB — HEPATIC FUNCTION PANEL
ALT: 16 U/L (ref 0–35)
AST: 16 U/L (ref 0–37)
Albumin: 4.2 g/dL (ref 3.5–5.2)
Alkaline Phosphatase: 62 U/L (ref 39–117)
Bilirubin, Direct: 0.1 mg/dL (ref 0.0–0.3)
Total Bilirubin: 0.4 mg/dL (ref 0.2–1.2)
Total Protein: 6.8 g/dL (ref 6.0–8.3)

## 2024-10-03 ENCOUNTER — Ambulatory Visit: Payer: Self-pay | Admitting: Nurse Practitioner

## 2024-11-15 ENCOUNTER — Ambulatory Visit
Admission: RE | Admit: 2024-11-15 | Discharge: 2024-11-15 | Disposition: A | Discharge status: Home / Self Care | Source: Ambulatory Visit | Attending: Nurse Practitioner | Admitting: Nurse Practitioner

## 2024-11-15 DIAGNOSIS — Z780 Asymptomatic menopausal state: Secondary | ICD-10-CM | POA: Insufficient documentation

## 2024-11-15 DIAGNOSIS — Z1231 Encounter for screening mammogram for malignant neoplasm of breast: Secondary | ICD-10-CM | POA: Insufficient documentation

## 2025-07-13 ENCOUNTER — Ambulatory Visit

## 2025-08-15 ENCOUNTER — Encounter: Admitting: Nurse Practitioner
